# Patient Record
Sex: Female | Born: 1935
Health system: Southern US, Community
[De-identification: ages and names within clinical notes are randomized; demographics above are authoritative.]

## PROBLEM LIST (undated history)

## (undated) DIAGNOSIS — I10 Essential (primary) hypertension: Secondary | ICD-10-CM

## (undated) DIAGNOSIS — H3530 Unspecified macular degeneration: Secondary | ICD-10-CM

## (undated) DIAGNOSIS — H348192 Central retinal vein occlusion, unspecified eye, stable: Secondary | ICD-10-CM

## (undated) DIAGNOSIS — R251 Tremor, unspecified: Secondary | ICD-10-CM

## (undated) DIAGNOSIS — I2510 Atherosclerotic heart disease of native coronary artery without angina pectoris: Secondary | ICD-10-CM

## (undated) DIAGNOSIS — B029 Zoster without complications: Secondary | ICD-10-CM

## (undated) HISTORY — DX: Central retinal vein occlusion, unspecified eye, stable: H34.8192

## (undated) HISTORY — DX: Unspecified macular degeneration: H35.30

---

## 1999-08-20 DIAGNOSIS — I2510 Atherosclerotic heart disease of native coronary artery without angina pectoris: Secondary | ICD-10-CM

## 1999-08-20 HISTORY — DX: Atherosclerotic heart disease of native coronary artery without angina pectoris: I25.10

## 2006-07-16 ENCOUNTER — Ambulatory Visit: Payer: Self-pay | Admitting: Internal Medicine

## 2007-01-22 ENCOUNTER — Ambulatory Visit: Payer: Self-pay | Admitting: Internal Medicine

## 2007-06-11 ENCOUNTER — Other Ambulatory Visit: Payer: Self-pay

## 2007-06-11 ENCOUNTER — Emergency Department: Payer: Self-pay | Admitting: Internal Medicine

## 2008-12-16 ENCOUNTER — Ambulatory Visit: Payer: Self-pay | Admitting: Family Medicine

## 2008-12-16 ENCOUNTER — Encounter: Payer: Self-pay | Admitting: Sports Medicine

## 2008-12-16 DIAGNOSIS — G252 Other specified forms of tremor: Secondary | ICD-10-CM | POA: Insufficient documentation

## 2008-12-16 DIAGNOSIS — E785 Hyperlipidemia, unspecified: Secondary | ICD-10-CM | POA: Insufficient documentation

## 2008-12-16 DIAGNOSIS — I2510 Atherosclerotic heart disease of native coronary artery without angina pectoris: Secondary | ICD-10-CM | POA: Insufficient documentation

## 2008-12-16 DIAGNOSIS — G250 Essential tremor: Secondary | ICD-10-CM

## 2008-12-16 DIAGNOSIS — I10 Essential (primary) hypertension: Secondary | ICD-10-CM | POA: Insufficient documentation

## 2008-12-16 LAB — CONVERTED CEMR LAB
ALT: 28 U/L (ref 0–35)
AST: 33 U/L (ref 0–37)
Albumin: 4 g/dL (ref 3.5–5.2)
Alkaline Phosphatase: 170 units/L — ABNORMAL HIGH (ref 39–117)
BUN: 19 mg/dL (ref 6–23)
CO2: 23 meq/L (ref 19–32)
Calcium: 9 mg/dL (ref 8.4–10.5)
Chloride: 106 meq/L (ref 96–112)
Cholesterol: 139 mg/dL (ref 0–200)
Creatinine, Ser: 0.96 mg/dL (ref 0.40–1.20)
Glucose, Bld: 106 mg/dL — ABNORMAL HIGH (ref 70–99)
HCT: 35.8 % — ABNORMAL LOW (ref 36.0–46.0)
HDL: 40 mg/dL (ref >39)
Hemoglobin: 11.8 g/dL — ABNORMAL LOW (ref 12.0–15.0)
LDL Cholesterol: 67 mg/dL (ref 0–99)
MCHC: 33 g/dL (ref 30.0–36.0)
MCV: 93.5 fL (ref 78.0–100.0)
Platelets: 213 K/uL (ref 150–400)
Potassium: 4.1 meq/L (ref 3.5–5.3)
RBC: 3.83 M/uL — ABNORMAL LOW (ref 3.87–5.11)
RDW: 13.8 % (ref 11.5–15.5)
Sodium: 141 meq/L (ref 135–145)
TSH: 1.577 u[IU]/mL (ref 0.350–4.500)
Total Bilirubin: 0.2 mg/dL — ABNORMAL LOW (ref 0.3–1.2)
Total CHOL/HDL Ratio: 3.5
Total Protein: 7.1 g/dL (ref 6.0–8.3)
Triglycerides: 162 mg/dL — ABNORMAL HIGH (ref <150)
VLDL: 32 mg/dL (ref 0–40)
WBC: 7.5 10*3/uL (ref 4.0–10.5)

## 2008-12-19 ENCOUNTER — Encounter: Payer: Self-pay | Admitting: Sports Medicine

## 2008-12-19 ENCOUNTER — Encounter: Admission: RE | Admit: 2008-12-19 | Discharge: 2008-12-19 | Payer: Self-pay | Admitting: Sports Medicine

## 2009-01-05 ENCOUNTER — Ambulatory Visit: Payer: Self-pay | Admitting: Family Medicine

## 2009-01-05 DIAGNOSIS — M810 Age-related osteoporosis without current pathological fracture: Secondary | ICD-10-CM | POA: Insufficient documentation

## 2009-01-05 DIAGNOSIS — R627 Adult failure to thrive: Secondary | ICD-10-CM | POA: Insufficient documentation

## 2009-01-12 ENCOUNTER — Ambulatory Visit: Payer: Self-pay | Admitting: Family Medicine

## 2009-02-17 ENCOUNTER — Ambulatory Visit: Payer: Self-pay | Admitting: Family Medicine

## 2009-04-20 ENCOUNTER — Ambulatory Visit: Payer: Self-pay | Admitting: Family Medicine

## 2009-04-27 ENCOUNTER — Encounter: Payer: Self-pay | Admitting: Sports Medicine

## 2009-05-02 ENCOUNTER — Ambulatory Visit: Payer: Self-pay | Admitting: Family Medicine

## 2009-05-23 ENCOUNTER — Encounter: Payer: Self-pay | Admitting: Sports Medicine

## 2009-05-24 ENCOUNTER — Ambulatory Visit: Payer: Self-pay | Admitting: Family Medicine

## 2009-06-14 ENCOUNTER — Ambulatory Visit: Payer: Self-pay | Admitting: Family Medicine

## 2009-08-21 ENCOUNTER — Encounter (INDEPENDENT_AMBULATORY_CARE_PROVIDER_SITE_OTHER): Payer: Self-pay | Admitting: *Deleted

## 2009-08-21 DIAGNOSIS — F17200 Nicotine dependence, unspecified, uncomplicated: Secondary | ICD-10-CM

## 2009-09-01 ENCOUNTER — Telehealth: Payer: Self-pay | Admitting: Sports Medicine

## 2009-09-05 ENCOUNTER — Ambulatory Visit: Payer: Self-pay | Admitting: Family Medicine

## 2009-12-05 ENCOUNTER — Encounter: Payer: Self-pay | Admitting: Family Medicine

## 2009-12-05 ENCOUNTER — Ambulatory Visit: Payer: Self-pay | Admitting: Family Medicine

## 2009-12-05 LAB — CONVERTED CEMR LAB
BUN: 18 mg/dL (ref 6–23)
Chloride: 108 meq/L (ref 96–112)
Potassium: 3.9 meq/L (ref 3.5–5.3)

## 2009-12-20 ENCOUNTER — Ambulatory Visit: Payer: Self-pay | Admitting: Family Medicine

## 2009-12-20 ENCOUNTER — Encounter: Payer: Self-pay | Admitting: Sports Medicine

## 2009-12-20 DIAGNOSIS — B351 Tinea unguium: Secondary | ICD-10-CM

## 2009-12-20 DIAGNOSIS — J309 Allergic rhinitis, unspecified: Secondary | ICD-10-CM | POA: Insufficient documentation

## 2009-12-20 LAB — CONVERTED CEMR LAB
ALT: 28 units/L (ref 0–35)
AST: 35 units/L (ref 0–37)
Albumin: 3.9 g/dL (ref 3.5–5.2)
Alkaline Phosphatase: 197 U/L — ABNORMAL HIGH (ref 39–117)
Bilirubin, Direct: 0.1 mg/dL (ref 0.0–0.3)
Indirect Bilirubin: 0.2 mg/dL (ref 0.0–0.9)
Total Bilirubin: 0.3 mg/dL (ref 0.3–1.2)
Total Protein: 7.3 g/dL (ref 6.0–8.3)

## 2009-12-21 ENCOUNTER — Telehealth: Payer: Self-pay | Admitting: Sports Medicine

## 2009-12-22 ENCOUNTER — Ambulatory Visit: Payer: Self-pay | Admitting: Family Medicine

## 2009-12-25 ENCOUNTER — Ambulatory Visit: Payer: Self-pay | Admitting: Family Medicine

## 2010-02-01 ENCOUNTER — Telehealth: Payer: Self-pay | Admitting: Sports Medicine

## 2010-02-12 ENCOUNTER — Ambulatory Visit: Payer: Self-pay | Admitting: Family Medicine

## 2010-02-12 ENCOUNTER — Encounter: Payer: Self-pay | Admitting: Family Medicine

## 2010-02-12 LAB — CONVERTED CEMR LAB
MCHC: 31.1 g/dL (ref 30.0–36.0)
MCV: 94.7 fL (ref 78.0–100.0)
Platelets: 336 10*3/uL (ref 150–400)
TSH: 0.962 microintl units/mL (ref 0.350–4.500)
WBC: 11.1 10*3/uL — ABNORMAL HIGH (ref 4.0–10.5)

## 2010-02-14 ENCOUNTER — Telehealth: Payer: Self-pay | Admitting: Family Medicine

## 2010-02-15 ENCOUNTER — Ambulatory Visit: Payer: Self-pay | Admitting: Family Medicine

## 2010-03-09 ENCOUNTER — Ambulatory Visit: Payer: Self-pay | Admitting: Family Medicine

## 2010-03-20 ENCOUNTER — Ambulatory Visit: Payer: Self-pay | Admitting: Family Medicine

## 2010-04-27 ENCOUNTER — Encounter: Payer: Self-pay | Admitting: Sports Medicine

## 2010-04-27 ENCOUNTER — Encounter: Payer: Self-pay | Admitting: *Deleted

## 2010-06-05 ENCOUNTER — Ambulatory Visit: Payer: Self-pay | Admitting: Family Medicine

## 2010-06-11 ENCOUNTER — Encounter: Payer: Self-pay | Admitting: Sports Medicine

## 2010-06-11 ENCOUNTER — Ambulatory Visit: Payer: Self-pay | Admitting: Family Medicine

## 2010-06-11 LAB — CONVERTED CEMR LAB: Direct LDL: 73 mg/dL

## 2010-06-12 ENCOUNTER — Encounter: Payer: Self-pay | Admitting: Sports Medicine

## 2010-06-25 ENCOUNTER — Encounter: Payer: Self-pay | Admitting: Sports Medicine

## 2010-09-10 ENCOUNTER — Ambulatory Visit: Admission: RE | Admit: 2010-09-10 | Discharge: 2010-09-10 | Payer: Self-pay | Source: Home / Self Care

## 2010-09-18 NOTE — Assessment & Plan Note (Signed)
Summary: BONIVA INJ/KH   Vital Signs:  Patient profile:   77 year old female Height:      63 inches Weight:      103 pounds BMI:     18.31 Temp:     98.0 degrees F oral Pulse rate:   66 / minute BP sitting:   132 / 74  (right arm) Cuff size:   regular  Vitals Entered By: Tessie Fass CMA (March 09, 2010 10:16 AM) CC: boniva injection   Primary Care Provider:  Rodney Langton, M.D.  CC:  boniva injection.  History of Present Illness: Here for IV Boniva.  Reports weight loss assoicated with loss of taste.  She believes it is due to the oral antifungal prescribed for onycomycosis of right great nail. She has stopped taking it.  She is concerned about her nail not improving.  Discussed that this is a long process.    Primidone prescribed at [facility] for tremor helped, she was not given refils.  She has an apt with Dr. Karie Schwalbe August 2. to discuss these and other issues.  Current Medications (verified): 1)  Aspirin Ec Low Dose 81 Mg Tbec (Aspirin) .... One Tab By Mouth Daily 2)  Toprol Xl 100 Mg Xr24h-Tab (Metoprolol Succinate) .... One Tab By Mouth Daily 3)  Lisinopril 10 Mg Tabs (Lisinopril) .... One Tab By Mouth Daily 4)  Simvastatin 40 Mg Tabs (Simvastatin) .... One Half Tab By Mouth Daily 5)  D 1000 1000 Unit Tabs (Cholecalciferol) .... One Tab By Mouth Daily 6)  Boniva 3 Mg/33ml Kit (Ibandronate Sodium) .... Inj Q 3 Months 7)  Calcium Antacid 500 Mg Chew (Calcium Carbonate Antacid) .... 4 Per Day 8)  Flonase 50 Mcg/act Susp (Fluticasone Propionate) .... One Spray in Each Nostril Two Times A Day Even When Not Having Symptoms. 9)  Primidone 50 Mg Tabs (Primidone) .... One Half Tab By Mouth Qhs 10)  Mirtazapine 7.5 Mg Tabs (Mirtazapine) .Marland Kitchen.. 1 Tab By Mouth Qhs 11)  Terbinafine Hcl 250 Mg Tabs (Terbinafine Hcl) .... Hold  Allergies (verified): No Known Drug Allergies  Review of Systems       The patient complains of anorexia.  The patient denies fever, peripheral edema,  headaches, and abdominal pain.   MS:  denies subtrochanteric pain in legs.  Physical Exam  General:  alert, frail, head bobbint tremor, quiet spoken Extremities:  Left great toe nail, thickened, appears to have normal growth at the bed.   Impression & Recommendations:  Problem # 1:  ONYCHOMYCOSIS, TOENAILS (ICD-110.1)  Hold for now.  Topical care with soaking, filing and using Methol rub daily.  Consider pulse therapy (weekely for 6 months) since it appears it intefers with ability to eat secondary to loss of taste and subsequent weight loss. Her updated medication list for this problem includes:    Terbinafine Hcl 250 Mg Tabs (Terbinafine hcl) ..... Hold  Her updated medication list for this problem includes:    Terbinafine Hcl 250 Mg Tabs (Terbinafine hcl) ..... Hold  Orders: [facility]- Est  Level 4 (99214)  Problem # 2:  TREMOR, ESSENTIAL (ICD-333.1)  refilled primidone, did not give refills will let primary MD and patient decided if she is to continue  Orders: [facility]- Est  Level 4 (16109)  Problem # 3:  OSTEOPOROSIS (ICD-733.00)  IVP Boniva given today, no complications, no complaints of leg pain. Boniva Lot #[id number], exp 9/12 Her updated medication list for this problem includes:    D 1000 1000 Unit Tabs (  Cholecalciferol) ..... One tab by mouth daily    Boniva 3 Mg/59ml Kit (Ibandronate sodium) ..... Inj q 3 months  Orders: Provider Misc Charge- [facility] (Misc)  Her updated medication list for this problem includes:    D 1000 1000 Unit Tabs (Cholecalciferol) ..... One tab by mouth daily    Boniva 3 Mg/67ml Kit (Ibandronate sodium) ..... Inj q 3 months  Complete Medication List: 1)  Aspirin Ec Low Dose 81 Mg Tbec (Aspirin) .... One tab by mouth daily 2)  Toprol Xl 100 Mg Xr24h-tab (Metoprolol succinate) .... One tab by mouth daily 3)  Lisinopril 10 Mg Tabs (Lisinopril) .... One tab by mouth daily 4)  Simvastatin 40 Mg Tabs (Simvastatin) .... One half tab by mouth daily 5)  D  1000 1000 Unit Tabs (Cholecalciferol) .... One tab by mouth daily 6)  Boniva 3 Mg/30ml Kit (Ibandronate sodium) .... Inj q 3 months 7)  Calcium Antacid 500 Mg Chew (Calcium carbonate antacid) .... 4 per day 8)  Flonase 50 Mcg/act Susp (Fluticasone propionate) .... One spray in each nostril two times a day even when not having symptoms. 9)  Primidone 50 Mg Tabs (Primidone) .... One half tab by mouth qhs 10)  Mirtazapine 7.5 Mg Tabs (Mirtazapine) .Marland Kitchen.. 1 tab by mouth qhs 11)  Terbinafine Hcl 250 Mg Tabs (Terbinafine hcl) .... Hold  Patient Instructions: 1)  Recommend topical care of nails with soaking, filing, and methol rub daily.  Discuss with Dr. Karie Schwalbe change to pulse therapy. 2)  COntinue primidone and pay attention to if it help treamor and if she wants to continue using this. 3)  Apt with primary MD in 2 weeks Prescriptions: PRIMIDONE 50 MG TABS (PRIMIDONE) One half tab by mouth qHS  #30 x 0   Entered and Authorized by:   Luretha Murphy NP   Signed by:   Luretha Murphy NP on 03/09/2010   Method used:   Printed then faxed to ...       [facility] [address] [phone]* (retail)       [address] [place], [place]  [address]       Ph: [phone]       Fax: [phone]   RxID:   [id number]

## 2010-09-18 NOTE — Progress Notes (Signed)
Summary: refill  Phone Note Refill Request Call back at Home Phone [phone] Message from:  Patient  Refills Requested: Medication #1:  PROPRANOLOL HCL 40 MG TABS 2 tabs by mouth BID [facility] [place]  Initial call taken by: De Nurse,  September 01, 2009 2:29 PM  Follow-up for Phone Call        to PCP Follow-up by: Gladstone Pih,  September 01, 2009 3:15 PM    New/Updated Medications: PROPRANOLOL HCL 80 MG TABS (PROPRANOLOL HCL) One tab by mouth BID Prescriptions: PROPRANOLOL HCL 80 MG TABS (PROPRANOLOL HCL) One tab by mouth BID  #180 x 11   Entered and Authorized by:   Rodney Langton MD   Signed by:   Rodney Langton MD on 09/02/2009   Method used:   Electronically to        [facility] [phone]* (retail)       [address] [place], [place]  [address]       Ph: [phone]       Fax: [phone]   RxID:   [id number]

## 2010-09-18 NOTE — Progress Notes (Signed)
Summary: Results  Phone Note Outgoing Call Call back at Home Phone [phone]   Call placed by: Romero Belling MD,  February 14, 2010 11:44 AM Call placed to: Patient Summary of Call: No answer.  Left message:  results back (did not give details of what).  Advised that nothing requiring action now but should be discussed in appointment which is scheduled in geri clinic tomorrow morning.  Did not give any details of labs or what results I had reviewed.

## 2010-09-18 NOTE — Consult Note (Signed)
 Summary: [facility]  [facility]   Imported By: Madelin Daring 05/04/2009 15:51:27  _____________________________________________________________________  External Attachment:    Type:   Image     Comment:   External Document

## 2010-09-18 NOTE — Assessment & Plan Note (Signed)
Summary: f/u,df   Vital Signs:  Patient profile:   77 year old female Weight:      101.4 pounds Temp:     97.9 degrees F oral Pulse rate:   71 / minute Pulse rhythm:   regular BP sitting:   142 / 88  (left arm) Cuff size:   regular  Vitals Entered By: Loralee Pacas CMA (June 11, 2010 1:51 PM) CC: follow-up visit   Primary Care Xitlalli Newhard:  Rodney Langton, M.D.  CC:  follow-up visit.  History of Present Illness: 77 yo female here for fu of chronic issues.  HTN:  BP ok, no problems with meds.  ECHO performed by cards and normal.   Cholesterol:  Controlled at last check > 1 yr ago, due for recheck.  FTT:  Unable to tolerate higher doses of her remeron, amenable to increase again.  Feels well.  No weakness.  Weight stable since last visit.  Tremor:  Essential.  Better with primidone.  Some neck pain/stiffness.  No radicular symptoms.  Habits & Providers  Alcohol-Tobacco-Diet     Alcohol drinks/day: <1     Alcohol type: wine     Tobacco Status: current     Tobacco Counseling: to quit use of tobacco products     Cigarette Packs/Day: 0.5     Year Started: 1950  Current Medications (verified): 1)  Aspirin Ec Low Dose 81 Mg Tbec (Aspirin) .... One Tab By Mouth Daily 2)  Toprol Xl 100 Mg Xr24h-Tab (Metoprolol Succinate) .... One Tab By Mouth Daily 3)  Lisinopril 10 Mg Tabs (Lisinopril) .... One Tab By Mouth Daily 4)  Simvastatin 40 Mg Tabs (Simvastatin) .... One Half Tab By Mouth Daily 5)  D 1000 1000 Unit Tabs (Cholecalciferol) .... One Tab By Mouth Daily 6)  Boniva 3 Mg/7ml Kit (Ibandronate Sodium) .... Inj Q 3 Months 7)  Calcium Antacid 500 Mg Chew (Calcium Carbonate Antacid) .... 4 Per Day 8)  Flonase 50 Mcg/act Susp (Fluticasone Propionate) .... One Spray in Each Nostril Two Times A Day Even When Not Having Symptoms. 9)  Primidone 50 Mg Tabs (Primidone) .... One Half Tab By Mouth Qhs 10)  Mirtazapine 7.5 Mg Tabs (Mirtazapine) .... One Half  Tab By Mouth Qhs 11)   Grifulvin V 500 Mg Tabs (Griseofulvin Microsize) .... 2 Tabs By Mouth Daily X 6 Months.  Allergies (verified): No Known Drug Allergies  Past History:  Past Surgical History: Bilateral Tubal Ligation CAD s/p stenting in 2001 by Dr. Ria Bush at [facility] Cholecystectomy 2D ECHO normal 05/17/10  Review of Systems       See HPI  Physical Exam  General:  Well-developed,well-nourished,in no acute distress; alert,appropriate and cooperative throughout examination Lungs:  Normal respiratory effort, chest expands symmetrically. Lungs are clear to auscultation, no crackles or wheezes. Heart:  Normal rate and regular rhythm. S1 and S2 normal without gallop, murmur, click, rub or other extra sounds. Abdomen:  Bowel sounds positive,abdomen soft and non-tender without masses, organomegaly or hernias noted. Extremities:  No clubbing, cyanosis, edema, or deformity noted.    Impression & Recommendations:  Problem # 1:  FAILURE TO THRIVE, ADULT (ICD-783.7) Assessment Unchanged Pt to increase to full tab mirtazepine.  This is still only 7.5 mg and best wt gain noted at 15mg  dose. At some point can try to increase to full dose however subjectively she feels well and we would only be treating a number. Had been using nutritional shakes from [facility] but hasn't been going lately because  she has a tough time getting to [facility].  Problem # 2:  ONYCHOMYCOSIS, TOENAILS (ICD-110.1) Assessment: Unchanged Still has medication left.   RTC when finished course of treatment.  Her updated medication list for this problem includes:    Grifulvin V 500 Mg Tabs (Griseofulvin microsize) .Marland Kitchen... 2 tabs by mouth daily x 6 months.  Problem # 3:  TREMOR, ESSENTIAL (ICD-333.1) Assessment: Unchanged Cont primidone. Rehab exercises given for neck pain.  Orders: [facility]- Est  Level 4 (16109)  Problem # 4:  HYPERLIPIDEMIA (ICD-272.4) Assessment: Unchanged Checking dLDL.  Her updated medication  list for this problem includes:    Simvastatin 40 Mg Tabs (Simvastatin) ..... One half tab by mouth daily  Orders: Direct LDL-[facility] ([address]454-09811) [facility]- Est  Level 4 (91478)  Problem # 5:  HYPERTENSION, ESSENTIAL (ICD-401.9) Assessment: Unchanged Liberal with her BPs as wouldn't want her to fall and BPs have been controlled in the recent past.  Her updated medication list for this problem includes:    Toprol Xl 100 Mg Xr24h-tab (Metoprolol succinate) ..... One tab by mouth daily    Lisinopril 10 Mg Tabs (Lisinopril) ..... One tab by mouth daily  Complete Medication List: 1)  Aspirin Ec Low Dose 81 Mg Tbec (Aspirin) .... One tab by mouth daily 2)  Toprol Xl 100 Mg Xr24h-tab (Metoprolol succinate) .... One tab by mouth daily 3)  Lisinopril 10 Mg Tabs (Lisinopril) .... One tab by mouth daily 4)  Simvastatin 40 Mg Tabs (Simvastatin) .... One half tab by mouth daily 5)  D 1000 1000 Unit Tabs (Cholecalciferol) .... One tab by mouth daily 6)  Boniva 3 Mg/33ml Kit (Ibandronate sodium) .... Inj q 3 months 7)  Calcium Antacid 500 Mg Chew (Calcium carbonate antacid) .... 4 per day 8)  Flonase 50 Mcg/act Susp (Fluticasone propionate) .... One spray in each nostril two times a day even when not having symptoms. 9)  Primidone 50 Mg Tabs (Primidone) .... One half tab by mouth qhs 10)  Mirtazapine 7.5 Mg Tabs (Mirtazapine) .... One half  tab by mouth qhs 11)  Grifulvin V 500 Mg Tabs (Griseofulvin microsize) .... 2 tabs by mouth daily x 6 months.  Patient Instructions: 1)  Great to see you today! 2)  Checking cholesterol today, will forward to your cardiologist. 3)  Do the neck exercises. 4)  Come back in November for your flu shot. 5)  -Dr. Karie Schwalbe.   Orders Added: 1)  Direct LDL-[facility] [[id number]] 2)  [facility]- Est  Level 4 [57846]

## 2010-09-18 NOTE — Miscellaneous (Signed)
Summary: npi FOR CARDS  Clinical Lists Changes gave NPI for Dr. Ann Maki at [facility] (cards) for her appt there today.Golden Circle RN  April 27, 2010 11:36 AM

## 2010-09-18 NOTE — Assessment & Plan Note (Signed)
 Summary: f/up,tcb   Vital Signs:  Patient profile:   77 year old female Weight:      101 pounds Temp:     97.5 degrees F oral Pulse rate:   75 / minute BP sitting:   164 / 89  (left arm)  Vitals Entered By: Letitia Reusing (Jan 05, 2009 2:54 PM)  CC: f/u Is Patient Diabetic? No   CC:  f/u.  History of Present Illness: 8 WF with HTN, HLD, essential tremor, CAD presents for follow up of medical problems.  Osteoporosis:  Meets criteria per recent DEXA.  Will start treatment.  Pt has tried fosamax before and was unable to tolerate it 2/2 perceived fatigue and so she refuses to take it.  Recommended q40month Boniva  and she was amenable to trying this.  HLD:  Taking 1/2 tab of Zocor  40 daily.  Lipids WNL except triglycerides.  CAD:  No further episodes of CP.  HTN:  Taking BP meds as prescribed.  No HA, blurry vision, CP, SOB.  Essential Tremor:  Pt feels that low dose propranolol helped significantly.  Would like to increase dose.  Still causing some neck pain.  Orthostatic Dizziness:  States that compression stockings are to expensive.  Would like to try support hose.  Still symptomatic when arising after sitting for a long period of time.  Weight loss/FTT:  Has been trying to gain weight but unable.  Just does not have the appetite.  Would like to try something to stimulate appetite.  Has lost 20-30 pounds over the past several years.  Habits & Providers     Tobacco Status: current     Cigarette Packs/Day: 0.5  Past History:  Past Medical History:    HTN    HLD    CAD s/p stent in 2001    MI in 2001    Orthostatic Hypotension    Hx Urinary retention in 1993    Hx Pancreatitis in 1994 (12/16/2008)  Past Surgical History:    Bilateral Tubal Ligation    CAD s/p stenting in 2001 by Dr. Tawana at [facility]    Cholecystectomy (12/16/2008)  Family History:    Heart disease in both maternal and paternal sides.    Mother had a stroke.    Hx Bone cancer  in family (12/16/2008)  Social History:    Lives at home alone, daughter Dorthea Dene Boers very helpful    Does all ADLs and IADLs independently.    Likes to go bowling and play computer games.    Completed High School    Drives a car (12/16/2008)  Social History:    Packs/Day:  0.5  Review of Systems       See HPI  Physical Exam  General:  Well-developed,well-nourished,in no acute distress; alert,appropriate and cooperative throughout examination Lungs:  Normal respiratory effort, chest expands symmetrically. Lungs are clear to auscultation, no crackles or wheezes. Heart:  Normal rate and regular rhythm. S1 and S2 normal without gallop, murmur, click, rub or other extra sounds. Extremities:  No clubbing, cyanosis, edema, or deformity noted with normal full range of motion of all joints.     Impression & Recommendations:  Problem # 1:  OSTEOPOROSIS (ICD-733.00) Assessment New Will refer to Geriatrics for Boniva  injections.    Her updated medication list for this problem includes:    D 1000 1000 Unit Tabs (Cholecalciferol ) ..... One tab by mouth daily    Boniva  3 Mg/67ml Kit (Ibandronate  sodium) ..... Inj q 3 months  Orders: [facility]- Est  Level 4 (99214)  Problem # 2:  TREMOR, ESSENTIAL (ICD-333.1) Assessment: Improved Increased propranolol.  Pt feels that this helps her tremor.    Orders: [facility]- Est  Level 4 (00785)  Problem # 3:  ORTHOSTATIC DIZZINESS (ICD-780.4) Assessment: Unchanged Still symptomatic and still hasn't gotten the compression hose because it is too expensive.  She agrees to try support hose.  If still symptomatic can consider liberalizing salt in diet however will need to keep her blood pressure in mind as well.  Orders: [facility]- Est  Level 4 (00785)  Problem # 4:  HYPERTENSION, ESSENTIAL (ICD-401.9) Assessment: Improved BP increased, with age and CAD, stroke and ACS are risks, BP elevated at this visit so will increase toprol  XL.  There has been no  interaction thusfar with propranolol and her pulse seems to tolerate well.  Still will titrate up meds slowly.  Her updated medication list for this problem includes:    Toprol  Xl 100 Mg Xr24h-tab (Metoprolol  succinate)    Lisinopril  10 Mg Tabs (Lisinopril ) ..... One tab by mouth daily  Orders: [facility]- Est  Level 4 (00785)  Problem # 5:  FAILURE TO THRIVE, ADULT (ICD-783.7) Very common in the geriatric population.  Will try Mirtazepine to stimulate appetite and track weights at each visit.    Complete Medication List: 1)  Aspirin  Ec Low Dose 81 Mg Tbec (Aspirin ) .... One tab by mouth daily 2)  Toprol  Xl 100 Mg Xr24h-tab (Metoprolol  succinate) 3)  Lisinopril  10 Mg Tabs (Lisinopril ) .... One tab by mouth daily 4)  Simvastatin  40 Mg Tabs (Simvastatin ) .... One half tab by mouth daily 5)  Propranolol Hcl 40 Mg Tabs (Propranolol hcl) .... 2 tabs by mouth bid 6)  Mirtazapine  15 Mg Tbdp (Mirtazapine ) .... One tab by mouth qhs 7)  Cyclobenzaprine Hcl 5 Mg Tabs (Cyclobenzaprine hcl) .... One tab by mouth qhs 8)  D 1000 1000 Unit Tabs (Cholecalciferol ) .... One tab by mouth daily 9)  Boniva  3 Mg/36ml Kit (Ibandronate  sodium) .... Inj q 3 months  Patient Instructions: 1)  Great to see you today, lots of changes. 2)  Take 2 of your propranolol two times a day.  This will help with the tremor. 3)  Take the cyclobenzaprine before bedtime, this can help with your neck pain. 4)  Increase your Toprol  XL to 100mg  daily to help your blood pressure. 5)  Mirtazapine  will help with your appetite. 6)  Make an appointment with the geriatric clinic to see Ms Gerlene our nurse practitioner.  I would like her to start giving your Boniva  injections every 3 months.  You should also take a vitamin D  supplement. 7)  Don't forget the support hose. 8)  Come back to see me in one month to see how things are going. Prescriptions: TOPROL  XL 100 MG XR24H-TAB (METOPROLOL  SUCCINATE)   #90 x 3   Entered and Authorized by:    Debby Petties MD   Signed by:   Debby Petties MD on 01/05/2009   Method used:   Print then Give to Patient   RxID:   [id number] D 1000 1000 UNIT TABS (CHOLECALCIFEROL ) One tab by mouth daily  #90 x 3   Entered and Authorized by:   Debby Petties MD   Signed by:   Debby Petties MD on 01/05/2009   Method used:   Print then Give to Patient   RxID:   [id number] CYCLOBENZAPRINE HCL 5 MG TABS (CYCLOBENZAPRINE HCL) One tab by mouth qHS  #  90 x 3   Entered and Authorized by:   Debby Petties MD   Signed by:   Debby Petties MD on 01/05/2009   Method used:   Print then Give to Patient   RxID:   [id number] MIRTAZAPINE  15 MG TBDP (MIRTAZAPINE ) One tab by mouth qHS  #90 x 3   Entered and Authorized by:   Debby Petties MD   Signed by:   Debby Petties MD on 01/05/2009   Method used:   Print then Give to Patient   RxID:   [id number]

## 2010-09-18 NOTE — Miscellaneous (Signed)
 Summary: rib injury  Clinical Lists Changes spoke with dtr. pt & family heard loud pop Saturday when getting off bleachers at football game. pt denied pain. afterwards had rib pain & area is swollen. declined ED or visit here today. states she will be fine until tomorrow. appt made with pcp at 11am. told dtr that if pt starts coughing up blood, pain increases or difficulty breathing, call 911 & go to ED .she verbalized understanding...SABRASABRAGinnie Mau RN  May 23, 2009 3:04 PM

## 2010-09-18 NOTE — Assessment & Plan Note (Signed)
Summary: F/U PER DR LINTHAVONG/[facility]   Vital Signs:  Patient profile:   77 year old female Weight:      111 pounds Temp:     98.1 degrees F oral Pulse rate:   69 / minute Pulse rhythm:   regular BP sitting:   148 / 79  (left arm)  Vitals Entered By: Loralee Pacas CMA (Dec 25, 2009 1:30 PM)  Primary Care Provider:  Rodney Langton, M.D.   History of Present Illness: 81F here for fu of essential tremor and onychomycosis.  Essential tremor:  Had tried primidone 50 qHS, had excessive drowsiness and stopped.  Tremor was GONE after taking the medication.  Amenable to trying one half tab.  Adverse Drug reaction:  See #1.  Drowsiness and feeling "not self."  Onychomycosis:  Almost done with doxy, taking terbinafine, LFTs nl, toe much better.  Current Medications (verified): 1)  Aspirin Ec Low Dose 81 Mg Tbec (Aspirin) .... One Tab By Mouth Daily 2)  Toprol Xl 100 Mg Xr24h-Tab (Metoprolol Succinate) .... One Tab By Mouth Daily 3)  Lisinopril 10 Mg Tabs (Lisinopril) .... One Tab By Mouth Daily 4)  Simvastatin 40 Mg Tabs (Simvastatin) .... One Half Tab By Mouth Daily 5)  Mirtazapine 15 Mg Tbdp (Mirtazapine) .... One Tab By Mouth Qhs 6)  Cyclobenzaprine Hcl 5 Mg Tabs (Cyclobenzaprine Hcl) .... One Tab By Mouth Qhs 7)  D 1000 1000 Unit Tabs (Cholecalciferol) .... One Tab By Mouth Daily 8)  Boniva 3 Mg/23ml Kit (Ibandronate Sodium) .... Inj Q 3 Months 9)  Calcium Antacid 500 Mg Chew (Calcium Carbonate Antacid) .... 4 Per Day 10)  Doxycycline Hyclate 100 Mg Tabs (Doxycycline Hyclate) .... One Tab By Mouth Two Times A Day X 7 Days 11)  Terbinafine Hcl 250 Mg Tabs (Terbinafine Hcl) .... One Tab By Mouth Daily X 12 Weeks 12)  Flonase 50 Mcg/act Susp (Fluticasone Propionate) .... One Spray in Each Nostril Two Times A Day Even When Not Having Symptoms. 13)  Primidone 50 Mg Tabs (Primidone) .... One Half Tab By Mouth Qhs  Allergies (verified): No Known Drug Allergies  Past History:  Past  Medical History: Last updated: 12/20/2009 HTN HLD CAD s/p stent in 2001 MI in 2001 Orthostatic Hypotension Hx Urinary retention in 1993 Hx Pancreatitis in 1994 Osteoporosis Onychomycosis in toenails. Essential Tremor  Review of Systems       See HPI  Physical Exam  General:  Well-developed,well-nourished,in no acute distress; alert,appropriate and cooperative throughout examination Lungs:  Normal respiratory effort, chest expands symmetrically. Lungs are clear to auscultation, no crackles or wheezes. Heart:  Normal rate and regular rhythm. S1 and S2 normal without gallop, murmur, click, rub or other extra sounds. Msk:  Toe mildly erythematous, no induration, no purulence, better than previous visit. Neurologic:  No cranial nerve deficits noted. Station and gait are normal. Plantar reflexes are down-going bilaterally. DTRs are symmetrical throughout. Sensory, motor and coordinative functions appear intact.  3hz  tremor in hands and neck.   Impression & Recommendations:  Problem # 1:  ADVERSE DRUG REACTION (ICD-995.20) Assessment Improved Resolved, will try primidone at lower dose.  Orders: [facility]- Est  Level 4 (62694)  Problem # 2:  ONYCHOMYCOSIS, TOENAILS (ICD-110.1) Assessment: Unchanged Cont terbinafine.  Her updated medication list for this problem includes:    Terbinafine Hcl 250 Mg Tabs (Terbinafine hcl) ..... One tab by mouth daily x 12 weeks  Orders: [facility]- Est  Level 4 (85462)  Problem # 3:  TREMOR, ESSENTIAL (ICD-333.1)  Assessment: Unchanged See #1, resolved with primidone but had ADR, will cont primidone at one half the dose.  Will call me in 4-5 days to let me know how treatment is going.  Orders: [facility]- Est  Level 4 (45409)  Complete Medication List: 1)  Aspirin Ec Low Dose 81 Mg Tbec (Aspirin) .... One tab by mouth daily 2)  Toprol Xl 100 Mg Xr24h-tab (Metoprolol succinate) .... One tab by mouth daily 3)  Lisinopril 10 Mg Tabs (Lisinopril) .... One tab by  mouth daily 4)  Simvastatin 40 Mg Tabs (Simvastatin) .... One half tab by mouth daily 5)  Mirtazapine 15 Mg Tbdp (Mirtazapine) .... One tab by mouth qhs 6)  Cyclobenzaprine Hcl 5 Mg Tabs (Cyclobenzaprine hcl) .... One tab by mouth qhs 7)  D 1000 1000 Unit Tabs (Cholecalciferol) .... One tab by mouth daily 8)  Boniva 3 Mg/78ml Kit (Ibandronate sodium) .... Inj q 3 months 9)  Calcium Antacid 500 Mg Chew (Calcium carbonate antacid) .... 4 per day 10)  Doxycycline Hyclate 100 Mg Tabs (Doxycycline hyclate) .... One tab by mouth two times a day x 7 days 11)  Terbinafine Hcl 250 Mg Tabs (Terbinafine hcl) .... One tab by mouth daily x 12 weeks 12)  Flonase 50 Mcg/act Susp (Fluticasone propionate) .... One spray in each nostril two times a day even when not having symptoms. 13)  Primidone 50 Mg Tabs (Primidone) .... One half tab by mouth qhs  Patient Instructions: 1)  Restart primidone at one half tab at bedtime. 2)  Refileld toprol. 3)  Come see me after 12 weeks to see how your toenail is doing. 4)  -Dr. Karie Schwalbe. Prescriptions: TOPROL XL 100 MG XR24H-TAB (METOPROLOL SUCCINATE) One tab by mouth daily  #90 x 3   Entered and Authorized by:   Rodney Langton MD   Signed by:   Rodney Langton MD on 12/25/2009   Method used:   Electronically to        [facility] [phone]* (retail)       [address] [place], [place]  [address]       Ph: [phone]       Fax: [phone]   RxID:   [id number]

## 2010-09-18 NOTE — Progress Notes (Signed)
Summary: triage  Phone Note Call from Patient Call back at Home Phone [phone]   Caller: Patient Summary of Call: needs to talk to nurse about how she has been feeling Initial call taken by: De Nurse,  February 01, 2010 11:20 AM  Follow-up for Phone Call        states she has been feeling tired,weak & worn out for past 2 wks. just wants to sleep. food does not taste good. wants to know what is going on. she "finished" mirtazapine & flexeril last week. told her she had refills. states she did feel ok when she was taking them. wants to know if md wants her to get the refill & keep taking or come see him. told her I will call hr back when md responds Follow-up by: Golden Circle RN,  February 01, 2010 11:28 AM  Additional Follow-up for Phone Call Additional follow up Details #1::        I would rather her continue with the mirtazepine, stop the flexeril.  Call if she needs any further refills on mirtezepine and let me know if her appetite returns.  If she doesn't feel better after 2 weeks on mirtazepine, should come in for possible dose adjustment. Additional Follow-up by: Rodney Langton MD,  February 01, 2010 11:42 AM    Additional Follow-up for Phone Call Additional follow up Details #2::    she agreed with plan. told her if she did not feel better before the 2wks are up, call & we will get her in to be seen. she agreed Follow-up by: Golden Circle RN,  February 01, 2010 11:57 AM

## 2010-09-18 NOTE — Assessment & Plan Note (Signed)
Summary: toe fungus/eo   Vital Signs:  Patient profile:   77 year old female Weight:      103.9 pounds Temp:     98.5 degrees F oral Pulse rate:   66 / minute Pulse rhythm:   regular BP sitting:   128 / 68  (right arm) Cuff size:   regular  Vitals Entered By: Loralee Pacas CMA (March 20, 2010 11:13 AM)  Primary Care Provider:  Rodney Langton, M.D.   History of Present Illness: Pt here with concerns re: fungus medicine.  Was taking mirtazepine for FTT, improved appetite significantly.  Terbinafine rx'ed however this made food taste bad to her.  She went off mirtazepine briefly and is off terbinafine.  She is now back to mirtazepine and supplementing with Ensure but still off terbinafine.  Pt feels her nails look better.  Tremor:  Slightly better with primidone, 1/2 tab.  Current Medications (verified): 1)  Aspirin Ec Low Dose 81 Mg Tbec (Aspirin) .... One Tab By Mouth Daily 2)  Toprol Xl 100 Mg Xr24h-Tab (Metoprolol Succinate) .... One Tab By Mouth Daily 3)  Lisinopril 10 Mg Tabs (Lisinopril) .... One Tab By Mouth Daily 4)  Simvastatin 40 Mg Tabs (Simvastatin) .... One Half Tab By Mouth Daily 5)  D 1000 1000 Unit Tabs (Cholecalciferol) .... One Tab By Mouth Daily 6)  Boniva 3 Mg/69ml Kit (Ibandronate Sodium) .... Inj Q 3 Months 7)  Calcium Antacid 500 Mg Chew (Calcium Carbonate Antacid) .... 4 Per Day 8)  Flonase 50 Mcg/act Susp (Fluticasone Propionate) .... One Spray in Each Nostril Two Times A Day Even When Not Having Symptoms. 9)  Primidone 50 Mg Tabs (Primidone) .... One Half Tab By Mouth Qhs 10)  Mirtazapine 7.5 Mg Tabs (Mirtazapine) .Marland Kitchen.. 1 Tab By Mouth Qhs 11)  Grifulvin V 500 Mg Tabs (Griseofulvin Microsize) .... 2 Tabs By Mouth Daily X 6 Months.  Allergies (verified): No Known Drug Allergies  Review of Systems       See HPI  Physical Exam  General:  Well-developed,well-nourished,in no acute distress; alert,appropriate and cooperative throughout  examination Skin:  L great toe, nail thickened, yellow, no signs paronyhia/eponychia.  Matrix slightly cleared.   Impression & Recommendations:  Problem # 1:  ONYCHOMYCOSIS, TOENAILS (ICD-110.1) Assessment Improved Changed to griseofulvin 1000 once daily x 6 mos.  Her updated medication list for this problem includes:    Grifulvin V 500 Mg Tabs (Griseofulvin microsize) .Marland Kitchen... 2 tabs by mouth daily x 6 months.  Orders: [facility]- Est  Level 4 (30865)  Problem # 2:  WEIGHT LOSS, RECENT (HQI-696.29) Assessment: Improved Cont mirtazepine and ensure.  RTC 3 mos to reassess wt loss.  Gained 0.9lb since last visit.  Orders: [facility]- Est  Level 4 (99214)  Problem # 3:  TREMOR, ESSENTIAL (ICD-333.1) Assessment: Improved Cont primidone 1/2 tab.  Orders: [facility]- Est  Level 4 (52841)  Complete Medication List: 1)  Aspirin Ec Low Dose 81 Mg Tbec (Aspirin) .... One tab by mouth daily 2)  Toprol Xl 100 Mg Xr24h-tab (Metoprolol succinate) .... One tab by mouth daily 3)  Lisinopril 10 Mg Tabs (Lisinopril) .... One tab by mouth daily 4)  Simvastatin 40 Mg Tabs (Simvastatin) .... One half tab by mouth daily 5)  D 1000 1000 Unit Tabs (Cholecalciferol) .... One tab by mouth daily 6)  Boniva 3 Mg/12ml Kit (Ibandronate sodium) .... Inj q 3 months 7)  Calcium Antacid 500 Mg Chew (Calcium carbonate antacid) .... 4 per day 8)  Flonase 50 Mcg/act Susp (Fluticasone propionate) .... One spray in each nostril two times a day even when not having symptoms. 9)  Primidone 50 Mg Tabs (Primidone) .... One half tab by mouth qhs 10)  Mirtazapine 7.5 Mg Tabs (Mirtazapine) .... One half  tab by mouth qhs 11)  Grifulvin V 500 Mg Tabs (Griseofulvin microsize) .... 2 tabs by mouth daily x 6 months.  Patient Instructions: 1)  Cont mirtazepine. 2)  Start griseofulvin x6 months for toenail fungus. 3)  Cont primidone. 4)  Come back to see me in 3 months. 5)  -Dr. Karie Schwalbe. Prescriptions: GRIFULVIN V 500 MG TABS (GRISEOFULVIN  MICROSIZE) 2 tabs by mouth daily x 6 months.  #180 x 1   Entered and Authorized by:   Rodney Langton MD   Signed by:   Rodney Langton MD on 03/20/2010   Method used:   Electronically to        [facility] [phone]* (retail)       [address] [place], [place]  [address]       Ph: [phone]       Fax: [phone]   RxID:   [id number]

## 2010-09-18 NOTE — Assessment & Plan Note (Signed)
Summary: fx rib?/Craig/Dr. T   Vital Signs:  Patient profile:   77 year old female Weight:      108 pounds Temp:     98 degrees F BP sitting:   166 / 79  Vitals Entered By: Lillia Pauls CMA (May 24, 2009 11:07 AM)  Primary Care Provider:  Rodney Langton, M.D.  CC:  rib pain.  History of Present Illness: Pleasant 77 yo F with osteoporosis, on calcium/vitamin D and bisphosphonate presenting with concern for rib fracture.  1. Rib Pain: patient was watching a game the other night and when it was over, her grandson helped her down from the bleachers. he stood on her left side and held her left hand in his left hand while putting his right hand on her side. when he put pressure on her right side, he felt and heard a "pop." the patient had no pain at that time and did not feel the pop. later, she started to feel pain on the right side where his hand had been, along 12th rib between mid-axillary and anterior-axillary line. she endorses pain with deep inspiration, palpation, and when she lays on that side, the pain is waking her from sleep. she denies chest pain, cough, hemoptysis, dyspnea, bruising.   2. HTN: Rx Toprol, Lisinopril. she has not taken her medications today and is in pain. she denies CP, SOB, N/V/D/C, HA, dizziness, lower extremity edema.  3. OP: Rx: Boniva, Calcium plus vitamin D. Per patient and her daughter, the patient has a history of low impact fractures of the ribs.   Current Medications (verified): 1)  Aspirin Ec Low Dose 81 Mg Tbec (Aspirin) .... One Tab By Mouth Daily 2)  Toprol Xl 100 Mg Xr24h-Tab (Metoprolol Succinate) 3)  Lisinopril 10 Mg Tabs (Lisinopril) .... One Tab By Mouth Daily 4)  Simvastatin 40 Mg Tabs (Simvastatin) .... One Half Tab By Mouth Daily 5)  Propranolol Hcl 40 Mg Tabs (Propranolol Hcl) .... 2 Tabs By Mouth Bid 6)  Mirtazapine 15 Mg Tbdp (Mirtazapine) .... One Tab By Mouth Qhs 7)  Cyclobenzaprine Hcl 5 Mg Tabs (Cyclobenzaprine Hcl) .... One  Tab By Mouth Qhs 8)  D 1000 1000 Unit Tabs (Cholecalciferol) .... One Tab By Mouth Daily 9)  Boniva 3 Mg/78ml Kit (Ibandronate Sodium) .... Inj Q 3 Months 10)  Calcium Antacid 500 Mg Chew (Calcium Carbonate Antacid) .... 4 Per Day  Allergies (verified): No Known Drug Allergies  Past History:  Past medical, surgical, family and social histories (including risk factors) reviewed for relevance to current acute and chronic problems.  Past Medical History: Reviewed history from 02/17/2009 and no changes required. HTN HLD CAD s/p stent in 2001 MI in 2001 Orthostatic Hypotension Hx Urinary retention in 1993 Hx Pancreatitis in 1994 Osteoporosis  Past Surgical History: Reviewed history from 12/16/2008 and no changes required. Bilateral Tubal Ligation CAD s/p stenting in 2001 by Dr. Ria Bush at [facility] Cholecystectomy  Family History: Reviewed history from 12/16/2008 and no changes required. Heart disease in both maternal and paternal sides. Mother had a stroke. Hx Bone cancer in family  Social History: Reviewed history from 12/16/2008 and no changes required. Lives at home alone, daughter Myles Lipps very helpful Does all ADLs and IADLs independently. Likes to go bowling and play computer games. Completed High School Drives a car  Review of Systems  The patient denies fever, weight loss, weight gain, chest pain, syncope, dyspnea on exertion, peripheral edema, prolonged cough, hemoptysis, abdominal pain, and  severe indigestion/heartburn.    Physical Exam  General:  Well-developed, well-nourished, in no acute distress; alert, appropriate and cooperative throughout examination. Vitals reviewed.  Lungs:  Normal respiratory effort, chest expands symmetrically. Lungs are clear to auscultation, no crackles or wheezes. Heart:  Normal rate and regular rhythm. S1 and S2 normal without gallop, murmur, click, rub or other extra sounds. Msk:  ttp along 12th rib  mid-axillary to anterior-axillary line, + edematous tissue, no step-off identified, no obvious deformity, no bruising.  Psych:  Oriented X3, memory intact for recent and remote, and normally interactive.     Impression & Recommendations:  Problem # 1:  FRACTURE, RIB, RIGHT (ICD-807.00) Assessment New  No red flags. No imaging needed at this time. Advised scheduled Tylenol three times a day for pain and provided prescription for rib belt. Instructed patient to wear only as needed as this could cause atelectasis and PNA if worn too often and deep breaths not taken. Red flags given. Follow up in 1-2 weeks with PCP.   Orders: [facility]- Est Level  3 (73220)  Problem # 2:  HYPERTENSION, ESSENTIAL (ICD-401.9) Assessment: Deteriorated  Patient has not taken medications today. Advised her to restart. Her updated medication list for this problem includes:    Toprol Xl 100 Mg Xr24h-tab (Metoprolol succinate)    Lisinopril 10 Mg Tabs (Lisinopril) ..... One tab by mouth daily    Propranolol Hcl 40 Mg Tabs (Propranolol hcl) .Marland Kitchen... 2 tabs by mouth bid  Orders: [facility]- Est Level  3 (25427)  Problem # 3:  OSTEOPOROSIS (ICD-733.00) Assessment: Unchanged  Her updated medication list for this problem includes:    D 1000 1000 Unit Tabs (Cholecalciferol) ..... One tab by mouth daily    Boniva 3 Mg/69ml Kit (Ibandronate sodium) ..... Inj q 3 months  Orders: [facility]- Est Level  3 (06237)  Complete Medication List: 1)  Aspirin Ec Low Dose 81 Mg Tbec (Aspirin) .... One tab by mouth daily 2)  Toprol Xl 100 Mg Xr24h-tab (Metoprolol succinate) 3)  Lisinopril 10 Mg Tabs (Lisinopril) .... One tab by mouth daily 4)  Simvastatin 40 Mg Tabs (Simvastatin) .... One half tab by mouth daily 5)  Propranolol Hcl 40 Mg Tabs (Propranolol hcl) .... 2 tabs by mouth bid 6)  Mirtazapine 15 Mg Tbdp (Mirtazapine) .... One tab by mouth qhs 7)  Cyclobenzaprine Hcl 5 Mg Tabs (Cyclobenzaprine hcl) .... One tab by mouth qhs 8)  D 1000  1000 Unit Tabs (Cholecalciferol) .... One tab by mouth daily 9)  Boniva 3 Mg/74ml Kit (Ibandronate sodium) .... Inj q 3 months 10)  Calcium Antacid 500 Mg Chew (Calcium carbonate antacid) .... 4 per day  Other Orders: Flu Vaccine 56yrs + (62831) Admin 1st Vaccine (51761)  Patient Instructions: 1)  It was nice to meet you today! 2)  It looks like you have another nondisplaced rib fracture, probably with some muscle strain. 3)  Take Tylenol three times a day x 1 week for pain. 4)  I am also prescribing a rib belt to help with pain. 5)  Please call or go to the emergency department if you have chest pain, problems breathing, or if you are coughing blood. 6)  Please follow up with your PCP in 1-2 weeks.   Immunizations Administered:  Influenza Vaccine # 1:    Vaccine Type: Fluvax 3+    Site: left deltoid    Mfr: [employer]    Dose: 0.5 ml    Route: IM    Given by: Shanda Bumps  Fleeger CMA    Exp. Date: 02/15/2010    Lot #: [id number]    VIS given: 03/12/07 version given May 24, 2009.  Flu Vaccine Consent Questions:    Do you have a history of severe allergic reactions to this vaccine? no    Any prior history of allergic reactions to egg and/or gelatin? no    Do you have a sensitivity to the preservative Thimersol? no    Do you have a past history of Guillan-Barre Syndrome? no    Do you currently have an acute febrile illness? no    Have you ever had a severe reaction to latex? no    Vaccine information given and explained to patient? yes    Are you currently pregnant? no   Prevention & Chronic Care Immunizations   Influenza vaccine: Fluvax 3+  (05/24/2009)    Tetanus booster: Not documented    Pneumococcal vaccine: Not documented    H. zoster vaccine: Not documented  Colorectal Screening   Hemoccult: Not documented   Hemoccult due: Not Indicated    Colonoscopy: Not documented   Colonoscopy due: Not Indicated  Other Screening   Pap smear: Not documented   Pap  smear due: Not Indicated    Mammogram: Not documented   Mammogram due: Not Indicated    DXA bone density scan: Not documented   Smoking status: current  (05/02/2009)  Lipids   Total Cholesterol: 139  (12/16/2008)   LDL: 67  (12/16/2008)   LDL Direct: Not documented   HDL: 40  (12/16/2008)   Triglycerides: 162  (12/16/2008)    SGOT (AST): 33  (12/16/2008)   SGPT (ALT): 28  (12/16/2008)   Alkaline phosphatase: 170  (12/16/2008)   Total bilirubin: 0.2  (12/16/2008)    Lipid flowsheet reviewed?: Yes   Progress toward LDL goal: Unchanged  Hypertension   Last Blood Pressure: 166 / 79  (05/24/2009)   Serum creatinine: 0.96  (12/16/2008)   Serum potassium 4.1  (12/16/2008)    Hypertension flowsheet reviewed?: Yes   Progress toward BP goal: Deteriorated  Self-Management Support :   Personal Goals (by the next clinic visit) :      Personal blood pressure goal: 140/90  (05/24/2009)     Personal LDL goal: 130  (05/24/2009)    Patient will work on the following items until the next clinic visit to reach self-care goals:     Medications and monitoring: take my medicines every day, bring all of my medications to every visit  (05/24/2009)     Eating: drink diet soda or water instead of juice or soda, eat more vegetables, use fresh or frozen vegetables, eat foods that are low in salt, eat baked foods instead of fried foods, eat fruit for snacks and desserts, limit or avoid alcohol  (05/24/2009)     Activity: take a 30 minute walk every day  (05/24/2009)    Hypertension self-management support: Written self-care plan  (05/24/2009)   Hypertension self-care plan printed.    Lipid self-management support: Written self-care plan  (05/24/2009)   Lipid self-care plan printed.

## 2010-09-18 NOTE — Assessment & Plan Note (Signed)
Summary: depression,df   Vital Signs:  Patient profile:   77 year old female Height:      63 inches Weight:      104.3 pounds BMI:     18.54 Temp:     98.4 degrees F oral Pulse rate:   74 / minute BP sitting:   142 / 73  (left arm) Cuff size:   regular  Vitals Entered By: Garen Grams LPN (February 12, 2010 10:08 AM) CC: not eating, no energy, ? depression Is Patient Diabetic? No Pain Assessment Patient in pain? yes     Location: back   Primary Care Provider:  Rodney Langton, M.D.  CC:  not eating, no energy, and ? depression.  History of Present Illness: 77 yo female presenting with concerns about her mood  MOOD DISORDER (ICD-296.90):  "I don't feel like I should."  Not eating well, decreased energy, wonders if she has depression.  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  The problem has been going on for approximately 1 year now, but she and her daughter feel like it has gotten worse the past 2-3 weeks.  She says she has been worried about her children a lot the past 2-3 weeks because her son is moving back to the area and her daughter is ill.  She has been seen by her PCP for failure to thrive in the past with question of mood disorder versus dementia.  She tried to take Mirtazapine in the past (15 mg by mouth at bedtime) but stopped because she felt disoriented and somnolent on it.  She likewise felt weak and somnolent when she started Primidone for tremor but feels better now that she cut her dose to 1/4 - 1/2 tablet at bedtime.  She denies suicidal ideation.  She lives alone, but sees her daughter frequently and relies on her daughter and neighbor for rides.  Patient denies falls.   Habits & Providers  Alcohol-Tobacco-Diet     Tobacco Status: current     Tobacco Counseling: to quit use of tobacco products  Allergies: No Known Drug Allergies  Physical Exam  Additional Exam:   VITALS:  Reviewed, normal GEN: Alert & oriented, no acute distress NECK: Midline trachea, no masses/thyromegaly, no cervical lymphadenopathy CARDIO: Regular rate and rhythm, no murmurs/rubs/gallops, 2+ bilateral radial pulses, no carotid bruit RESP: Clear to auscultation, normal work of breathing, no retractions/accessory muscle use  PHQ 9 1 - 3 2 - 2 3 - 3 4 - 3 5 - 3 6 - 3 7 - 3 8 - 3 9 - 0   SUICIDAL?? -- NO TOTAL:  23 / 27 10 - DIFFICULTY = SOMEWHAT    Impression & Recommendations:  Problem # 1:  MOOD DISORDER (ICD-296.90) Assessment Unchanged Sounds like severe loss of function over the past year.  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 No suicidal ideation.  Will check CBC and TSH.  Evaluated the following in clinic today:  - ADLs:  independent  - IADLs:  partially dependent  - MMSE:  30 / 30  MMSE points away from dementia as an underlying component here.  Would consider primary mood disorder.  Mirtazapine was a good first choice of antidepressant as she has had a 7 pound weight loss in the past month.  Given tremor would also consider  Parkinson disease.  Patient has cataract surgery scheduled for tomorrow and will be recovering for a few days from that, so I would be hesitant to start a new medicine at this point, especially since I will be leaving clinic on Thursday and cannot monitor.  Discussed this with patient and daughter and advised that a visit to our geriatric clinic would be a good next step to finish the workup that I started today.  Patient and daughter are in agreement with this plan and will schedule an appointment for next week if possible.  Should follow up with PCP for this issue in the future after geriatric clinic.  Greater than 25 minutes spent with patient, more than 50% in face-to-face counseling. Orders: CBC-[facility] (16109) TSH-[facility] ([id number]) [facility]- Est  Level 4 (91478)  Complete Medication List: 1)  Aspirin Ec Low  Dose 81 Mg Tbec (Aspirin) .... One tab by mouth daily 2)  Toprol Xl 100 Mg Xr24h-tab (Metoprolol succinate) .... One tab by mouth daily 3)  Lisinopril 10 Mg Tabs (Lisinopril) .... One tab by mouth daily 4)  Simvastatin 40 Mg Tabs (Simvastatin) .... One half tab by mouth daily 5)  D 1000 1000 Unit Tabs (Cholecalciferol) .... One tab by mouth daily 6)  Boniva 3 Mg/41ml Kit (Ibandronate sodium) .... Inj q 3 months 7)  Calcium Antacid 500 Mg Chew (Calcium carbonate antacid) .... 4 per day 8)  Terbinafine Hcl 250 Mg Tabs (Terbinafine hcl) .... One tab by mouth daily x 12 weeks 9)  Flonase 50 Mcg/act Susp (Fluticasone propionate) .... One spray in each nostril two times a day even when not having symptoms. 10)  Primidone 50 Mg Tabs (Primidone) .... One half tab by mouth qhs  Patient Instructions: 1)  Pleasure to meet you today. 2)  Please schedule a follow-up appointment in 1-2 weeks in the GERIATRIC CLINIC.   Geriatric Assessment:  Activities of Daily Living:    Bathing-independent    Dressing-independent    Eating-independent    Toileting-independent    Transferring-independent    Continence-independent Overall Assessment: independent  Instrumental Activities of Daily Living:    Transportation-dependent    Meal/Food Preparation-assisted    Shopping Errands-assisted    Housekeeping/Chores-assisted    Money Management/Finances-independent    Medication Management-independent    Ability to Use Telephone-independent    Laundry-independent    Driving limited by vision--does short trips, calls on daughter and neighbor as needed.  Has cataract surgery tomorrow. Overall Assessment: partially dependent  Mental Status Exam: (value/max value)    Orientation to Time: 5/5    Orientation to Place: 5/5    Registration: 3/3    Attention/Calculation: 5/5    Recall: 3/3    Language-name 2 objects: 2/2    Language-repeat: 1/1    Language-follow 3-step command: 3/3    Language-read and follow  direction: 1/1    Write a sentence: 1/1    Copy design: 1/1    DLROW MSE Total score: 30/30

## 2010-09-18 NOTE — Assessment & Plan Note (Signed)
 Summary: flu s/s per dtr/[place]   Vital Signs:  Patient profile:   77 year old female Height:      63 inches Weight:      105.25 pounds Temp:     99.1 degrees F oral Pulse rate:   84 / minute BP sitting:   149 / 76  (right arm)  Vitals Entered By: Arland Morel (May 02, 2009 11:32 AM)  CC: URI-HEADACHE-BODYACHES Is Patient Diabetic? No Pain Assessment Patient in pain? yes     Location: BODY ACHES Intensity: 8   Primary Care Provider:  Debby Petties, M.D.  CC:  URI-HEADACHE-BODYACHES.  History of Present Illness: 78F with several day hx cough, ST, body aches, runny nose.  Able to tolerate by mouth, no N/V/D/C, no neck stiffness, no rash, mildly swollen neck glands, no exposure to strep.  Red Flags STD exposure: No Breathing difficulty: No Drooling: No Trismus: No    Habits & Providers  Alcohol-Tobacco-Diet     Tobacco Status: current     Cigarette Packs/Day: 0.75  Allergies: No Known Drug Allergies  Past History:  Past Medical History: Last updated: 02/17/2009 HTN HLD CAD s/p stent in 2001 MI in 2001 Orthostatic Hypotension Hx Urinary retention in 1993 Hx Pancreatitis in 1994 Osteoporosis  Past Surgical History: Last updated: 12/16/2008 Bilateral Tubal Ligation CAD s/p stenting in 2001 by Dr. Tawana at [facility] Cholecystectomy  Family History: Last updated: 12/16/2008 Heart disease in both maternal and paternal sides. Mother had a stroke. Hx Bone cancer in family  Social History: Last updated: 12/16/2008 Lives at home alone, daughter Dorthea Dene Boers very helpful Does all ADLs and IADLs independently. Likes to go bowling and play computer games. Completed High School Drives a car  Social History: Packs/Day:  0.75  Review of Systems       See HPI  Physical Exam  General:  Well-developed,well-nourished,in no acute distress; alert,appropriate and cooperative throughout examination Head:  Normocephalic and  atraumatic without obvious abnormalities.  Eyes:  No corneal or conjunctival inflammation noted. EOMI. Perrla. Ears:  External ear exam shows no significant lesions or deformities.   Nose:  External nasal examination shows no deformity or inflammation.  Mouth:  Oral mucosa and oropharynx without lesions or exudates, mildly erythematous.  Teeth in good repair. Neck:  Mild LAD Chest Wall:  No deformities, masses, or tenderness noted. Lungs:  Normal respiratory effort, chest expands symmetrically. Lungs are clear to auscultation, no crackles or wheezes. Heart:  Normal rate and regular rhythm. S1 and S2 normal without gallop, murmur, click, rub or other extra sounds.   Impression & Recommendations:  Problem # 1:  UPPER RESPIRATORY INFECTION, VIRAL (ICD-465.9) Assessment New Symptomatic treatment, mucinex, hydration, RTC if not better in 2-3 weeks.  Her updated medication list for this problem includes:    Aspirin  Ec Low Dose 81 Mg Tbec (Aspirin ) ..... One tab by mouth daily  Orders: [facility]- Est Level  3 (00786)  Complete Medication List: 1)  Aspirin  Ec Low Dose 81 Mg Tbec (Aspirin ) .... One tab by mouth daily 2)  Toprol  Xl 100 Mg Xr24h-tab (Metoprolol  succinate) 3)  Lisinopril  10 Mg Tabs (Lisinopril ) .... One tab by mouth daily 4)  Simvastatin  40 Mg Tabs (Simvastatin ) .... One half tab by mouth daily 5)  Propranolol Hcl 40 Mg Tabs (Propranolol hcl) .... 2 tabs by mouth bid 6)  Mirtazapine  15 Mg Tbdp (Mirtazapine ) .... One tab by mouth qhs 7)  Cyclobenzaprine Hcl 5 Mg Tabs (Cyclobenzaprine hcl) .... One tab  by mouth qhs 8)  D 1000 1000 Unit Tabs (Cholecalciferol ) .... One tab by mouth daily 9)  Boniva  3 Mg/53ml Kit (Ibandronate  sodium) .... Inj q 3 months 10)  Calcium Antacid 500 Mg Chew (Calcium carbonate antacid) .... 4 per day  Patient Instructions: 1)  Great to see you today, 2)  You have a viral upper respiratory infection. These are self limited and go away on their own.  Be sure to  keep very well hydrated, use over the counter mucinex 1200mg  two times a day.  Do not use Mucinex-D. 3)  Come back to see me if not starting to get better in 2 weeks. 4)  -Dr. ONEIDA.

## 2010-09-18 NOTE — Assessment & Plan Note (Signed)
Summary: boniva inf,df   Vital Signs:  Patient profile:   77 year old female Height:      63 inches Weight:      109.7 pounds BMI:     19.50 Pulse rate:   66 / minute BP sitting:   133 / 72  (left arm)  Vitals Entered By: Arlyss Repress CMA, (September 05, 2009 8:36 AM) CC: Boniva IV Is Patient Diabetic? No Pain Assessment Patient in pain? no        Primary Care Provider:  Rodney Langton, M.D.  CC:  Boniva IV.  History of Present Illness: DIscussed course of bisphos therapy, generally recommendations are now for 5 years.  She was on by mouth Fosamax for 2, so will continue provided that she has no complications for 3 more years.  Reviewed Bone Density from 5/10, T scores of -2.0 at femur, and -2.9 at lumbar spine.  She denies any acute back pain or upper leg pain.  Is keeping up with supplemental Ca++ and Vitamin D.  Reviewed Creatitine was 0.93 in April 2010.  Will repeat with next injection.  Habits & Providers  Alcohol-Tobacco-Diet     Tobacco Status: current     Tobacco Counseling: to quit use of tobacco products     Cigarette Packs/Day: 0.5  Allergies: No Known Drug Allergies  Physical Exam  General:  In no distress, alert and oriented, daughter present.   Impression & Recommendations:  Problem # 1:  OSTEOPOROSIS (ICD-733.00)  IV Boniva given 3mg , excellent blood return and no complications at the site.  Patient tolerated procedure well. Her updated medication list for this problem includes:    D 1000 1000 Unit Tabs (Cholecalciferol) ..... One tab by mouth daily    Boniva 3 Mg/51ml Kit (Ibandronate sodium) ..... Inj q 3 months  Orders: Provider Misc Charge- [facility] (Misc)  Complete Medication List: 1)  Aspirin Ec Low Dose 81 Mg Tbec (Aspirin) .... One tab by mouth daily 2)  Toprol Xl 100 Mg Xr24h-tab (Metoprolol succinate) 3)  Lisinopril 10 Mg Tabs (Lisinopril) .... One tab by mouth daily 4)  Simvastatin 40 Mg Tabs (Simvastatin) .... One half tab by  mouth daily 5)  Propranolol Hcl 80 Mg Tabs (Propranolol hcl) .... One tab by mouth bid 6)  Mirtazapine 15 Mg Tbdp (Mirtazapine) .... One tab by mouth qhs 7)  Cyclobenzaprine Hcl 5 Mg Tabs (Cyclobenzaprine hcl) .... One tab by mouth qhs 8)  D 1000 1000 Unit Tabs (Cholecalciferol) .... One tab by mouth daily 9)  Boniva 3 Mg/58ml Kit (Ibandronate sodium) .... Inj q 3 months 10)  Calcium Antacid 500 Mg Chew (Calcium carbonate antacid) .... 4 per day  Patient Instructions: 1)  April apt with primary MD and Humberto Seals for [facility]

## 2010-09-18 NOTE — Assessment & Plan Note (Signed)
 Summary: FU/KH   Vital Signs:  Patient profile:   77 year old female Height:      63 inches Weight:      108 pounds BMI:     19.20 Temp:     98 degrees F Pulse rate:   72 / minute BP sitting:   135 / 77  Vitals Entered By: Nathanel Saba RN (June 14, 2009 10:34 AM) CC: Follow up rib fracture Is Patient Diabetic? No Pain Assessment Patient in pain? no        Primary Care Provider:  Debby Petties, M.D.  CC:  Follow up rib fracture.  History of Present Illness: Seen 10/6 for rib fx, rx'ed rib belt, has improved significantly since then, has been able to return to bowling, doesn't need belt anymore, just wears it for bowling but doesn't actually have pain.  No SOB, no cough, no hemoptysis, had swelling on right, posteriorly on 11th rib the size of a baseball but this has since resolved.  No other complaints.  Habits & Providers  Alcohol-Tobacco-Diet     Tobacco Status: current     Tobacco Counseling: to quit use of tobacco products     Cigarette Packs/Day: 0.5  Allergies: No Known Drug Allergies  Social History: Packs/Day:  0.5  Review of Systems       See HPI  Physical Exam  General:  Well-developed,well-nourished,in no acute distress; alert,appropriate and cooperative throughout examination Chest Wall:  Inspection reveals no abnormalities, diaphragmatic excursion and chest expansion normal and symmetric.  No palpable deformities on ANY of her 12 ribs, bilaterally. Lungs:  Normal respiratory effort, chest expands symmetrically. Lungs are clear to auscultation, no crackles or wheezes. Heart:  Normal rate and regular rhythm. S1 and S2 normal without gallop, murmur, click, rub or other extra sounds.   Impression & Recommendations:  Problem # 1:  FRACTURE, RIB, RIGHT (ICD-807.00) Assessment Improved Resolved, no imaging or treatment necessary.  Orders: [facility]- Est Level  3 (00786)  Complete Medication List: 1)  Aspirin  Ec Low Dose 81 Mg Tbec (Aspirin )  .... One tab by mouth daily 2)  Toprol  Xl 100 Mg Xr24h-tab (Metoprolol  succinate) 3)  Lisinopril  10 Mg Tabs (Lisinopril ) .... One tab by mouth daily 4)  Simvastatin  40 Mg Tabs (Simvastatin ) .... One half tab by mouth daily 5)  Propranolol Hcl 40 Mg Tabs (Propranolol hcl) .... 2 tabs by mouth bid 6)  Mirtazapine  15 Mg Tbdp (Mirtazapine ) .... One tab by mouth qhs 7)  Cyclobenzaprine Hcl 5 Mg Tabs (Cyclobenzaprine hcl) .... One tab by mouth qhs 8)  D 1000 1000 Unit Tabs (Cholecalciferol ) .... One tab by mouth daily 9)  Boniva  3 Mg/3ml Kit (Ibandronate  sodium) .... Inj q 3 months 10)  Calcium Antacid 500 Mg Chew (Calcium carbonate antacid) .... 4 per day  Patient Instructions: 1)  Great to see you two today, 2)  Your ribs are just fine, there is no further treatment or diagnostic studies needed.  You don't need to wear the band, but if it helps your bowling, wear it only when you bowl. 3)  Keep hitting those strikes when you bowl. 4)  Come back to see me as needed. 5)  -Dr. ONEIDA.

## 2010-09-18 NOTE — Assessment & Plan Note (Signed)
Summary: adverse drug rxn to primidone- not life threatening   Vital Signs:  Patient profile:   77 year old female Height:      63 inches Weight:      109.9 pounds BMI:     19.54 Temp:     98.1 degrees F oral Pulse rate:   76 / minute BP sitting:   106 / 65  (left arm) Cuff size:   regular  Vitals Entered By: Gladstone Pih (Dec 22, 2009 10:11 AM) CC: F/U from 2 days ago, see triage note Is Patient Diabetic? No   Primary Care Provider:  Rodney Langton, M.D.  CC:  F/U from 2 days ago and see triage note.  History of Present Illness: 77yo F here for f/u w/ c/o weakness and somnolence after starting the primidone  Adverse drug rxn: Pt was started on Primidone 2 days ago and dtr reports that she became more somnolent and weak soon thereafter.  No fevers, chills, N/V, facial droop, unilateral weakness, or dysarthria.  Dtr stopped the medication yesterday and noticed a slight improvement today.  No falls or syncopal events.  Habits & Providers  Alcohol-Tobacco-Diet     Tobacco Status: current     Tobacco Counseling: to quit use of tobacco products     Cigarette Packs/Day: 0.5  Current Medications (verified): 1)  Aspirin Ec Low Dose 81 Mg Tbec (Aspirin) .... One Tab By Mouth Daily 2)  Toprol Xl 100 Mg Xr24h-Tab (Metoprolol Succinate) 3)  Lisinopril 10 Mg Tabs (Lisinopril) .... One Tab By Mouth Daily 4)  Simvastatin 40 Mg Tabs (Simvastatin) .... One Half Tab By Mouth Daily 5)  Mirtazapine 15 Mg Tbdp (Mirtazapine) .... One Tab By Mouth Qhs 6)  Cyclobenzaprine Hcl 5 Mg Tabs (Cyclobenzaprine Hcl) .... One Tab By Mouth Qhs 7)  D 1000 1000 Unit Tabs (Cholecalciferol) .... One Tab By Mouth Daily 8)  Boniva 3 Mg/27ml Kit (Ibandronate Sodium) .... Inj Q 3 Months 9)  Calcium Antacid 500 Mg Chew (Calcium Carbonate Antacid) .... 4 Per Day 10)  Doxycycline Hyclate 100 Mg Tabs (Doxycycline Hyclate) .... One Tab By Mouth Two Times A Day X 7 Days 11)  Terbinafine Hcl 250 Mg Tabs (Terbinafine  Hcl) .... One Tab By Mouth Daily X 12 Weeks 12)  Flonase 50 Mcg/act Susp (Fluticasone Propionate) .... One Spray in Each Nostril Two Times A Day Even When Not Having Symptoms.  Allergies (verified): No Known Drug Allergies  Review of Systems      See HPI  Physical Exam  General:  VS reviewed and rechecked.  Nonorthostatic.  Elderly female, A&O x 3, NAD essential tremor in neck and hands. Head:  normocephalic and atraumatic.   Eyes:  EOMI; asymmetric gaze PERRLA Neck:  supple and full ROM.   Lungs:  Normal respiratory effort, chest expands symmetrically. Lungs are clear to auscultation, no crackles or wheezes. Heart:  Normal rate and regular rhythm. S1 and S2 normal without gallop, murmur, click, rub or other extra sounds. Neurologic:  alert & oriented X3 and cranial nerves II-XII intact.   no facial droop no dysarthia no focal weakness slow and tremulous finger to nose nl alternating movements 5/5 strength all ext nl heel to shin   Impression & Recommendations:  Problem # 1:  ADVERSE DRUG REACTION (ICD-995.20) Assessment New  Non severe, non life threatening adverse drug rxn to the primidone. Seems to be improving since stopping the primidone. Will continue to hold it b/c of falls risk and have her  f/u with Dr. Karie Schwalbe to reassess next week. Orthostatics were nl. No signs of new neurological deficts that would require further imaging.  Orders: [facility]- Est Level  3 (16109)  Problem # 2:  FAILURE TO THRIVE, ADULT (ICD-783.7) Assessment: Comment Only I suspect some degree of mild to moderate dementia. Pt needs a MMSE and possible intervention to maintain independence. Will defer to Dr. Karie Schwalbe  Complete Medication List: 1)  Aspirin Ec Low Dose 81 Mg Tbec (Aspirin) .... One tab by mouth daily 2)  Toprol Xl 100 Mg Xr24h-tab (Metoprolol succinate) 3)  Lisinopril 10 Mg Tabs (Lisinopril) .... One tab by mouth daily 4)  Simvastatin 40 Mg Tabs (Simvastatin) .... One half tab by mouth  daily 5)  Mirtazapine 15 Mg Tbdp (Mirtazapine) .... One tab by mouth qhs 6)  Cyclobenzaprine Hcl 5 Mg Tabs (Cyclobenzaprine hcl) .... One tab by mouth qhs 7)  D 1000 1000 Unit Tabs (Cholecalciferol) .... One tab by mouth daily 8)  Boniva 3 Mg/68ml Kit (Ibandronate sodium) .... Inj q 3 months 9)  Calcium Antacid 500 Mg Chew (Calcium carbonate antacid) .... 4 per day 10)  Doxycycline Hyclate 100 Mg Tabs (Doxycycline hyclate) .... One tab by mouth two times a day x 7 days 11)  Terbinafine Hcl 250 Mg Tabs (Terbinafine hcl) .... One tab by mouth daily x 12 weeks 12)  Flonase 50 Mcg/act Susp (Fluticasone propionate) .... One spray in each nostril two times a day even when not having symptoms.  Patient Instructions: 1)  Schedule f/u with Dr. Karie Schwalbe early next week to discuss medication for tremors. 2)  Stop taking the primidone.

## 2010-09-18 NOTE — Miscellaneous (Signed)
Summary: [name]  Clinical Lists Changes  Problems: Added new problem of [name] (ICD-305.1) 

## 2010-09-18 NOTE — Progress Notes (Signed)
Summary: triage  Phone Note Call from Patient Call back at [phone]   Caller: daughter-Cindy Summary of Call: changed meds and got vaccines and not doing well today - needs to talk to nurse Initial call taken by: De Nurse,  Dec 21, 2009 1:58 PM  Follow-up for Phone Call        her mom was in yesterday. taking doxy for toe infection. new med primadone. got TDap & PNA shot.  states her head does not feel right but unable to explain it better. sleeping alot. ate cereal this am but went back to bed. c/o feeling cold. no fever. dtr is quite concerned. told her I will check with a doctor & call her back Follow-up by: Golden Circle RN,  Dec 21, 2009 2:16 PM  Additional Follow-up for Phone Call Additional follow up Details #1::        per Dr. Amaryllis Dyke, stop the primadone & be seen in clinic tomorrow. spoke with dtr & relayed this. told her to call 911 if her mom became unarousable or was more confused or anything that was not normal for her. she agreed Additional Follow-up by: Golden Circle RN,  Dec 21, 2009 2:28 PM    Additional Follow-up for Phone Call Additional follow up Details #2::    Noted, needs to be seen tomo morning ASAP for this.  Also if temp >101.59F, SOB, presyncope, needs to be seen ASAP @ [facility] or ER.   Follow-up by: Rodney Langton MD,  Dec 21, 2009 4:17 PM  Additional Follow-up for Phone Call Additional follow up Details #3:: Details for Additional Follow-up Action Taken: dtr states they have a 9:45am appt this am Additional Follow-up by: Golden Circle RN,  Dec 22, 2009 8:36 AM

## 2010-09-18 NOTE — Assessment & Plan Note (Signed)
 Summary: NEW PT/KH   Vital Signs:  Patient profile:   77 year old female Weight:      102.1 pounds (46.41 kg) Temp:     97.9 degrees F (36.61 degrees C) oral Pulse rate:   86 / minute BP sitting:   170 / 90  (left arm)  Vitals Entered By: Letitia Reusing (December 16, 2008 9:47 AM) Flex Sig Next Due:  Not Indicated Colonoscopy Next Due:  Not Indicated Hemoccult Next Due:  Not Indicated PAP Next Due:  Not Indicated Mammogram Next Due:  Not Indicated  CC: New Pt Is Patient Diabetic? No   History of Present Illness: 3 WF with HTN, HLD, CAD s/p stent, and c/o dizziness on standing and tremor in neck/head presents for initiation of care with me.  HTN:  Takes Toprol  XL 50 and Lisinopril .  BP elevated today even on manual recheck by me.  No HA, visual changes, CP, SOB, LE swelling.    HLD:  Takes zocor  40 (1/2 tab) daily.  Thinks that taking this medicine makes her dizzy so cut back from 40mg  tab  to 1/2 tab daily.  Only minor change noted in dizziness on standing.  CAD:  Had MI in 2001, plain stent placed by Dr. Tawana at [facility].  Took plavix for a while, now just on ASA 81.  No episodes CP or SOB or LE swelling since.  Dizziness on standing:  Occurs when she gets up from bed in the morning, or stands up too quickly.  Gets dizzy, feels like she's about to pass out, hands become clammy, has to sit down again.  No associated CP, SOB, no syncope, no LE swelling.  No numbness or tingling or weakness during these episodes.  They resolve spontaneously.  Tremor:  Has been going on for decades, approx 3hz  tremor in head, shaking, causes neck and trapezius pain occasionally.  No pill rolling tremor, gait normal, not shuffling or broad based, no incontinence.  Tremor worse with stress.  Habits & Providers     Alcohol drinks/day: <1     Alcohol type: wine     Tobacco Status: current     Tobacco Counseling: to quit use of tobacco products     Year Started: 1950     Does Patient  Exercise: yes     Type of exercise: Walking, Bowling     Exercise (avg: min/session): 30-60     Times/week: <3     STD Risk: never     Seat Belt Use: always     Sun Exposure: infrequent     Cardiologist: Dr. Tawana, [facility]  Past History:  Past Medical History:    HTN    HLD    CAD s/p stent in 2001    MI in 2001    Orthostatic Hypotension    Hx Urinary retention in 1993    Hx Pancreatitis in 1994  Past Surgical History:    Bilateral Tubal Ligation    CAD s/p stenting in 2001 by Dr. Tawana at [facility]    Cholecystectomy  Family History:    Heart disease in both maternal and paternal sides.    Mother had a stroke.    Hx Bone cancer in family  Social History:    Lives at home alone, daughter Dorthea Dene Boers very helpful    Does all ADLs and IADLs independently.    Likes to go bowling and play computer games.    Completed High School  Drives a car    Smoking Status:  current    Does Patient Exercise:  yes    Seat Belt Use:  always    STD Risk:  never    Sun Exposure-Excessive:  infrequent  Review of Systems       See HPI  Physical Exam  General:  Well-developed,well-nourished,in no acute distress; alert,appropriate and cooperative throughout examination Head:  Normocephalic and atraumatic without obvious abnormalities. 3hz  tremor noted Eyes:  No corneal or conjunctival inflammation noted. EOMI. Perrla. Ears:  External ear exam shows no significant lesions or deformities.   Nose:  External nasal examination shows no deformity or inflammation.  Mouth:  Oral mucosa and oropharynx without lesions or exudates.   Neck:  No deformities, masses, or tenderness noted. Chest Wall:  No deformities, masses, or tenderness noted. Lungs:  Normal respiratory effort, chest expands symmetrically. Lungs are clear to auscultation, no crackles or wheezes. Heart:  Normal rate and regular rhythm. S1 and S2 normal without gallop, murmur,  click, rub or other extra sounds. Abdomen:  Bowel sounds positive,abdomen soft and non-tender without masses, organomegaly or hernias noted. Extremities:  No clubbing, cyanosis, edema, or deformity noted with normal full range of motion of all joints.   Neurologic:  Grossly non-focal Skin:  Intact without suspicious lesions or rashes   Impression & Recommendations:  Problem # 1:  HYPERTENSION, ESSENTIAL (ICD-401.9) Assessment New Elevated.  Will be adding propranolol for control of essential tremor, this should help BP somewhat.  If BP still elevated will increase metoprolol  XL to 100mg  daily at next visit in 2 weeks.  Will also check the below labs.  Her updated medication list for this problem includes:    Toprol  Xl 50 Mg Xr24h-tab (Metoprolol  succinate) ..... One tab by mouth daily    Lisinopril  10 Mg Tabs (Lisinopril ) ..... One tab by mouth daily    Propranolol Hcl 40 Mg Tabs (Propranolol hcl) ..... One tab by mouth bid  Orders: Comp Met-[facility] [phone]) CBC-[facility] (14972) TSH-[facility] [phone])  Problem # 2:  HYPERLIPIDEMIA (ICD-272.4) Assessment: New Taking zocor .  Will check lipids to establish baseline.  Her updated medication list for this problem includes:    Simvastatin  40 Mg Tabs (Simvastatin ) ..... One half tab by mouth daily  Orders: Lipid-[facility] ([id number])  Problem # 3:  TREMOR, ESSENTIAL (ICD-333.1) Assessment: New Likely essential, with difficulty gaining weight will check TSH.  Will also start propranolol.  This will help BP and tremor.  Opt to continue metoprolol  as well. If no benefit with propranolol, can try primidone . Reassess at next visit.  Orders: TSH-[facility] ([id number])  Problem # 4:  NECK AND BACK PAIN (ICD-723.1) Assessment: New Likely 2/2 neck tremor, at her age however and with her risk factors, thin, white, 77 years old, will check Bone Density.  Recommended heating pads, tylenol, icy/hot balm, massage.  Her updated medication list for this  problem includes:    Aspirin  Ec Low Dose 81 Mg Tbec (Aspirin ) ..... One tab by mouth daily  Orders: Dexa scan (Dexa scan)  Problem # 5:  ORTHOSTATIC DIZZINESS (ICD-780.4) Unlikely anginal equivalent, symptoms strongly suggestive of orthostatic hypotension.  Very common in her age group.  Recommended compression stockings.  Pt will get these and RTC 2 weeks to reassess.  Also recommended 8 glasses of water each day.  Orders: Comp Met-[facility] [phone]) CBC-[facility] (14972) TSH-[facility] [phone])  Problem # 6:  CORONARY ARTERY DISEASE (ICD-414.00) Assessment: New S/p stenting in 2001.  No episodes of CP since.  Will  risk stratify with lipid panel.  Goals will be to normalize BP and keep cholesterol under control.  Her updated medication list for this problem includes:    Aspirin  Ec Low Dose 81 Mg Tbec (Aspirin ) ..... One tab by mouth daily    Toprol  Xl 50 Mg Xr24h-tab (Metoprolol  succinate) ..... One tab by mouth daily    Lisinopril  10 Mg Tabs (Lisinopril ) ..... One tab by mouth daily    Propranolol Hcl 40 Mg Tabs (Propranolol hcl) ..... One tab by mouth bid  Orders: Lipid-[facility] ([id number]) CBC-[facility] (14972) TSH-[facility] ([id number])  Complete Medication List: 1)  Aspirin  Ec Low Dose 81 Mg Tbec (Aspirin ) .... One tab by mouth daily 2)  Toprol  Xl 50 Mg Xr24h-tab (Metoprolol  succinate) .... One tab by mouth daily 3)  Lisinopril  10 Mg Tabs (Lisinopril ) .... One tab by mouth daily 4)  Simvastatin  40 Mg Tabs (Simvastatin ) .... One half tab by mouth daily 5)  Propranolol Hcl 40 Mg Tabs (Propranolol hcl) .... One tab by mouth bid  Patient Instructions: 1)  Great to see you today, 2)  Go to the lab to check TSH, CBC, CMET, Lipid panel.  I also want to check your bone density. 3)  I want you to go to your pharmacy, or any pharmacy and get some knee high compression stockings to help prevent dizziness on standing. 4)  We will start a low dose of propranolol 40mg  PO BID to help with your tremor. 5)   Come back to see me in 2 weeks to reassess your blood pressure and tremor and go over your labs. 6)  -Dr. ONEIDA. Prescriptions: PROPRANOLOL HCL 40 MG TABS (PROPRANOLOL HCL) One tab by mouth BID  #60 x 6   Entered and Authorized by:   Debby Petties MD   Signed by:   Debby Petties MD on 12/16/2008   Method used:   Electronically to        [facility] [phone]* (retail)       [address] [place], [place]  [address]       Ph: [phone]       Fax: [phone]   RxID:   [id number]    Geriatric Assessment:  Activities of Daily Living:    Bathing-independent    Dressing-independent    Eating-independent    Toileting-independent    Transferring-independent    Continence-independent  Instrumental Activities of Daily Living:    Transportation-independent    Meal/Food Preparation-independent    Shopping Errands-independent    Housekeeping/Chores-independent    Money Management/Finances-independent    Medication Management-independent    Ability to Use Telephone-independent    Laundry-independent  Balance: (value/max value)    Sitting balance: 1/1    Arises: 2/2    Attempts to arise: 2/2    Immediate standing balance: 2/2    Standing balance: 1/1    Nudged: 2/2    Eyes closed: 1/1    360 degree turn: 1/1    Sitting down: 2/2 Balance Total Score: 14/14  Gait: (value/max value)    Initiation of gait: 1/1    Step length-left: 1/1    Step height-left: 1/1    Step length-right: 1/1    Step height-right: 1/1    Step symmetry: 1/1    Step continuity: 1/1    Path: 2/2    Trunk: 2/2    Walking stance: 1/1 Gait Total Score: 12/12  Balance + Gait Total Score: 26/26

## 2010-09-18 NOTE — Consult Note (Signed)
Summary: [facility]  [facility]   Imported By: Clydell Hakim 05/16/2010 16:27:59  _____________________________________________________________________  External Attachment:    Type:   Image     Comment:   External Document

## 2010-09-18 NOTE — Assessment & Plan Note (Signed)
Summary: f/u per olson/kh   Vital Signs:  Patient profile:   77 year old female Height:      63 inches Weight:      104.4 pounds BMI:     18.56 Temp:     98.0 degrees F oral Pulse rate:   66 / minute BP sitting:   114 / 70  (left arm)  Vitals Entered By: Jimmy Footman, CMA (February 15, 2010 9:16 AM) CC: f/u bloodwork, loss of weight Is Patient Diabetic? No Pain Assessment Patient in pain? no        Primary Care Provider:  Rodney Langton, M.D.  CC:  f/u bloodwork and loss of weight.  History of Present Illness: Ms. Jankovich comes in to follow-up weight loss, depression, recent labwork. 1) Weight loss - Feels she is losing weight.  Attributes this to poor appetite which she attributes to change in taste as well as just not feeling hungry like she used to.  Feels like she smells okay.  Used to take mirtazapine for this but stopped it last month because she thought it made her groggy.  Looking through records, started on Mirtazapine 5/10.  Rx ran out in 5/11.  was out a few weeks before restarting it again. Took it once and felt funny so didn't take it again.  Reviwed weights: May 2010 - 101# - Mirtazapine started May 2011 - up to 111- but mirtazapine stopped Today - down to 104# (off of the mirtazapine)  Was on mirtazapine 15 and restarted at that dose last month when she felt funny.  Taste seemed to chage while on Terbinafine.  Has stopped that for about 2 weeks and taste seems to be returning to normal. Still not much appetite though. 2) Depression - says she may have been depressed before but she is feeling much better now.  Given Geriatric Depression Screen during visit and only had 2 positives out of 15 (5 or more is suggestive of depression).  Problem seems to be that her family "babies" her too - they won't let her do things or help out more.  3) Labwork - CBC, TSH essentially norml and patient informed of this.   Habits & Providers  Alcohol-Tobacco-Diet     Tobacco Status:  current     Cigarette Packs/Day: 0.5  Allergies: No Known Drug Allergies  Physical Exam  General:  thin, alert, NAD vitals reviewed Eyes:  amblyopia of R eye  Mouth:  MMM, OP clear Abdomen:  soft, nontender, nondistendedc Msk:  no rigidty of UEs Neurologic:  Narrow-based gait. Prolonged turn around Good balance, even with confrontation (nudge) Negative romberg No tremor with finger to nose  Psych:  Oriented X3, memory intact for recent and remote, normally interactive, good eye contact, not anxious appearing, and not depressed appearing.     Impression & Recommendations:  Problem # 1:  MOOD DISORDER (ICD-296.90) Assessment Improved  Improved.  NOt depressed appearing today.  Encouraged more socialization and encouraaged daughter to let her mother participate more in family chores, activities.   Orders: [facility]- Est Level  3 (16109)  Problem # 2:  FAILURE TO THRIVE, ADULT (ICD-783.7)  Did well on remeron.  Weight loss since off of it.  Will restart but at lower dose, 7.5mg , and can titrate up to 15 mg in a few weeks if indicated.  F/U with Dr. Karie Schwalbe.  Taste changes is side effect of terbinafine.  Since improving since stopping terbinafine, was likely due to this.   Orders: [facility]- Est  Level  3 (99213)  Complete Medication List: 1)  Aspirin Ec Low Dose 81 Mg Tbec (Aspirin) .... One tab by mouth daily 2)  Toprol Xl 100 Mg Xr24h-tab (Metoprolol succinate) .... One tab by mouth daily 3)  Lisinopril 10 Mg Tabs (Lisinopril) .... One tab by mouth daily 4)  Simvastatin 40 Mg Tabs (Simvastatin) .... One half tab by mouth daily 5)  D 1000 1000 Unit Tabs (Cholecalciferol) .... One tab by mouth daily 6)  Boniva 3 Mg/70ml Kit (Ibandronate sodium) .... Inj q 3 months 7)  Calcium Antacid 500 Mg Chew (Calcium carbonate antacid) .... 4 per day 8)  Flonase 50 Mcg/act Susp (Fluticasone propionate) .... One spray in each nostril two times a day even when not having symptoms. 9)  Primidone 50 Mg Tabs  (Primidone) .... One half tab by mouth qhs 10)  Mirtazapine 7.5 Mg Tabs (Mirtazapine) .Marland Kitchen.. 1 tab by mouth qhs  Patient Instructions: 1)  It was a pleasure to meet you today.  Please schedule a follow-up appointment with Dr. Karie Schwalbe in 3-4 weeks to see how you are doing on the lower dose of mirtazapine. 2)  Enjoy the holiday weekend! Prescriptions: MIRTAZAPINE 7.5 MG TABS (MIRTAZAPINE) 1 tab by mouth qHS  #30 x 3   Entered and Authorized by:   Ardeen Garland  MD   Signed by:   Ardeen Garland  MD on 02/15/2010   Method used:   Print then Give to Patient   RxID:   [id number] MIRTAZAPINE 7.5 MG TABS (MIRTAZAPINE) 1 tab by mouth qHS  #30 x 3   Entered by:   Ardeen Garland  MD   Authorized by:   Zachery Dauer MD   Signed by:   Ardeen Garland  MD on 02/15/2010   Method used:   Print then Give to Patient   RxID:   [id number] MIRTAZAPINE 7.5 MG TABS (MIRTAZAPINE) 1 tab by mouth qHS  #30 x 3   Entered by:   Ardeen Garland  MD   Authorized by:   Zachery Dauer MD   Signed by:   Ardeen Garland  MD on 02/15/2010   Method used:   Print then Give to Patient   RxID:   [id number]

## 2010-09-18 NOTE — Assessment & Plan Note (Signed)
Summary: FU/KH   Vital Signs:  Patient profile:   77 year old female Weight:      110.7 pounds Temp:     97.8 degrees F oral Pulse rate:   64 / minute Pulse rhythm:   regular BP sitting:   144 / 80  (left arm) Cuff size:   regular  Vitals Entered By: Loralee Pacas CMA (Dec 20, 2009 2:15 PM) CC: follow-up visit Comments left great toe red and irritated x 2 weeks, wanted meds for runny nose   Primary Care Provider:  Rodney Langton, M.D.  CC:  follow-up visit.  History of Present Illness: Pt with several complaints:  Essential Tremor:  Has been using propranolol, symptoms improved but still present and bothering patient.  Interested in other options.  Right great toe very tender around toenail.  Has been using Epsom salt baths.  Runny nose, since last year, doesnt change particularly with seasons, clear, no itcy eyes, no cough, no ST, no fevers.  Habits & Providers  Alcohol-Tobacco-Diet     Tobacco Status: current     Tobacco Counseling: to quit use of tobacco products     Cigarette Packs/Day: 0.5  Current Medications (verified): 1)  Aspirin Ec Low Dose 81 Mg Tbec (Aspirin) .... One Tab By Mouth Daily 2)  Toprol Xl 100 Mg Xr24h-Tab (Metoprolol Succinate) 3)  Lisinopril 10 Mg Tabs (Lisinopril) .... One Tab By Mouth Daily 4)  Simvastatin 40 Mg Tabs (Simvastatin) .... One Half Tab By Mouth Daily 5)  Primidone 50 Mg Tabs (Primidone) .... One Tab By Mouth Qhs 6)  Mirtazapine 15 Mg Tbdp (Mirtazapine) .... One Tab By Mouth Qhs 7)  Cyclobenzaprine Hcl 5 Mg Tabs (Cyclobenzaprine Hcl) .... One Tab By Mouth Qhs 8)  D 1000 1000 Unit Tabs (Cholecalciferol) .... One Tab By Mouth Daily 9)  Boniva 3 Mg/67ml Kit (Ibandronate Sodium) .... Inj Q 3 Months 10)  Calcium Antacid 500 Mg Chew (Calcium Carbonate Antacid) .... 4 Per Day 11)  Doxycycline Hyclate 100 Mg Tabs (Doxycycline Hyclate) .... One Tab By Mouth Two Times A Day X 7 Days 12)  Terbinafine Hcl 250 Mg Tabs (Terbinafine Hcl)  .... One Tab By Mouth Daily X 12 Weeks 13)  Flonase 50 Mcg/act Susp (Fluticasone Propionate) .... One Spray in Each Nostril Two Times A Day Even When Not Having Symptoms.  Allergies (verified): No Known Drug Allergies  Past History:  Past Surgical History: Last updated: 12/16/2008 Bilateral Tubal Ligation CAD s/p stenting in 2001 by Dr. Ria Bush at [facility] Cholecystectomy  Past Medical History: HTN HLD CAD s/p stent in 2001 MI in 2001 Orthostatic Hypotension Hx Urinary retention in 1993 Hx Pancreatitis in 1994 Osteoporosis Onychomycosis in toenails. Essential Tremor  Review of Systems       See HPI  Physical Exam  General:  Well-developed,well-nourished,in no acute distress; alert,appropriate and cooperative throughout examination.  3hz  essential tremor in neck and hands. Lungs:  Normal respiratory effort, chest expands symmetrically. Lungs are clear to auscultation, no crackles or wheezes. Heart:  Normal rate and regular rhythm. S1 and S2 normal without gallop, murmur, click, rub or other extra sounds. Extremities:  Right great toenail yellow, thick, discolored.  Every tender surrounding, erythematous, no loculations or fluctuant collections palpable.  No purulence.   Impression & Recommendations:  Problem # 1:  ALLERGIC RHINITIS, CHRONIC (ICD-477.9) Assessment New See below.  Her updated medication list for this problem includes:    Flonase 50 Mcg/act Susp (Fluticasone propionate) ..... One spray  in each nostril two times a day even when not having symptoms.  Problem # 2:  ONYCHOMYCOSIS, TOENAILS (ICD-110.1) Assessment: New Checking LFTs then Lamisil x 12 weeks.  She also has an element of infection/peronychia.  There is no abscess to drain so will tx with doxy x 7 days to cover MRSA.  Her updated medication list for this problem includes:    Terbinafine Hcl 250 Mg Tabs (Terbinafine hcl) ..... One tab by mouth daily x 12 weeks    Doxycycline  100 mg by mouth two times a day x 7 days.  Orders: Hepatic-[facility] ([id number]) [facility]- Est  Level 4 (09811)  Problem # 3:  TREMOR, ESSENTIAL (ICD-333.1) Assessment: Unchanged Switched meds from propranolol to primidone.  Will start low (50mg ) and titrate up.  Problem # 4:  Preventive Health Care (ICD-V70.0) Assessment: Comment Only TDaP and Pneumococcal vaccine today.  Complete Medication List: 1)  Aspirin Ec Low Dose 81 Mg Tbec (Aspirin) .... One tab by mouth daily 2)  Toprol Xl 100 Mg Xr24h-tab (Metoprolol succinate) 3)  Lisinopril 10 Mg Tabs (Lisinopril) .... One tab by mouth daily 4)  Simvastatin 40 Mg Tabs (Simvastatin) .... One half tab by mouth daily 5)  Primidone 50 Mg Tabs (Primidone) .... One tab by mouth qhs 6)  Mirtazapine 15 Mg Tbdp (Mirtazapine) .... One tab by mouth qhs 7)  Cyclobenzaprine Hcl 5 Mg Tabs (Cyclobenzaprine hcl) .... One tab by mouth qhs 8)  D 1000 1000 Unit Tabs (Cholecalciferol) .... One tab by mouth daily 9)  Boniva 3 Mg/51ml Kit (Ibandronate sodium) .... Inj q 3 months 10)  Calcium Antacid 500 Mg Chew (Calcium carbonate antacid) .... 4 per day 11)  Doxycycline Hyclate 100 Mg Tabs (Doxycycline hyclate) .... One tab by mouth two times a day x 7 days 12)  Terbinafine Hcl 250 Mg Tabs (Terbinafine hcl) .... One tab by mouth daily x 12 weeks 13)  Flonase 50 Mcg/act Susp (Fluticasone propionate) .... One spray in each nostril two times a day even when not having symptoms.  Other Orders: Tdap => 62yrs IM (91478) Admin 1st Vaccine ([address]562) Pneumococcal Vaccine (13086) Admin of Any Addtl Vaccine (57846)  Patient Instructions: 1)  Pneumovax and TDaP 2)  Flonase for runny nose 3)  checking liver tests and Lamisil x 12 weeks for toenail 4)  Doxycycline x 7 days for painful toe 5)  Primidone for tremor, stop propranolol. 6)  -Dr. Karie Schwalbe. Prescriptions: FLONASE 50 MCG/ACT SUSP (FLUTICASONE PROPIONATE) One spray in each nostril two times a day even when not having  symptoms.  #1 bottle x 6   Entered and Authorized by:   Rodney Langton MD   Signed by:   Rodney Langton MD on 12/20/2009   Method used:   Electronically to        [facility] [phone]* (retail)       [address] [place], [place]  [address]       Ph: [phone]       Fax: [phone]   RxID:   [id number] TERBINAFINE HCL 250 MG TABS (TERBINAFINE HCL) One tab by mouth daily x 12 weeks  #84 x 0   Entered and Authorized by:   Rodney Langton MD   Signed by:   Rodney Langton MD on 12/20/2009   Method used:   Electronically to        [facility] [phone]* (retail)       [address]  [address]       [place], [place]  [address]       Ph: [phone]       Fax: [phone]   RxID:   [id number] DOXYCYCLINE HYCLATE 100 MG TABS (DOXYCYCLINE HYCLATE) One tab by mouth two times a day x 7 days  #14 x 0   Entered and Authorized by:   Rodney Langton MD   Signed by:   Rodney Langton MD on 12/20/2009   Method used:   Electronically to        [facility] [phone]* (retail)       [address] [place], [place]  [address]       Ph: [phone]       Fax: [phone]   RxID:   [id number] PRIMIDONE 50 MG TABS (PRIMIDONE) One tab by mouth qHS  #30 x 0   Entered and Authorized by:   Rodney Langton MD   Signed by:   Rodney Langton MD on 12/20/2009   Method used:   Electronically to        [facility] [phone]* (retail)       [address] [place], [place]  [address]       Ph: [phone]       Fax: [phone]   RxID:   [id number]   Last Flu Vaccine:  Fluvax 3+ (05/24/2009 10:52:07 AM) Flu Vaccine Next Due:  1 yr TD Result Date:  12/20/2009 TD Result:  given TD Next Due:  10 yr Pneumovax Result Date:  12/20/2009 Pneumovax Result:  given Pneumovax Next Due:  5 yr Bone Density Result Date:  12/19/2008 Bone Density Result:  abnormal Bone Density Next Due: Not Indicated Last Creatinine:  1.18  (12/05/2009 6:43:00 PM) Creatinine Next Due: 1 yr Last Potassium:  3.9 (12/05/2009 6:43:00 PM) Potassium Next Due:  1 yr   Immunizations Administered:  Tetanus Vaccine:    Vaccine Type: Tdap    Site: right deltoid    Mfr: GlaxoSmithKline    Dose: 0.5 ml    Route: IM    Given by: Loralee Pacas CMA    Exp. Date: 11/11/2011    Lot #: [id number]    VIS given: 07/07/07 version given Dec 20, 2009.  Pneumonia Vaccine:    Vaccine Type: Pneumovax (Medicare)    Site: right deltoid    Mfr: Merck    Dose: 0.5 ml    Route: IM    Given by: Loralee Pacas CMA    Exp. Date: 07/28/2010    Lot #: [id number]    VIS given: 03/16/96 version given Dec 20, 2009.

## 2010-09-18 NOTE — Assessment & Plan Note (Signed)
Summary: BONIVA/KH   Vital Signs:  Patient profile:   77 year old female Height:      63 inches Weight:      111 pounds BMI:     19.73 Temp:     98.0 degrees F oral Pulse rate:   68 / minute BP sitting:   143 / 72  (right arm) Cuff size:   regular  Vitals Entered By: Tessie Fass CMA (December 05, 2009 9:40 AM) CC: boniva injection Is Patient Diabetic? No Pain Assessment Patient in pain? no        Primary Care Provider:  Rodney Langton, M.D.  CC:  boniva injection.  History of Present Illness: Denies bone pain, has tolerated IVP Boniva.  Discussed options for smoking cessation  Discussed worsening to her benign essental tremor that affect her writing and head bobbing.  Habits & Providers  Alcohol-Tobacco-Diet     Tobacco Status: current     Tobacco Counseling: to quit use of tobacco products     Cigarette Packs/Day: 0.5  Allergies: No Known Drug Allergies  Physical Exam  General:  Head bobbing, no other notible tremor   Impression & Recommendations:  Problem # 1:  OSTEOPOROSIS (ICD-733.00)  IVP Boniva given, no complications Lot [id number], exp 05/12 Her updated medication list for this problem includes:    D 1000 1000 Unit Tabs (Cholecalciferol) ..... One tab by mouth daily    Boniva 3 Mg/64ml Kit (Ibandronate sodium) ..... Inj q 3 months  Orders: Provider Misc Charge- [facility] (Misc)  Problem # 2:  TREMOR, ESSENTIAL (ICD-333.1) Consider trial  of primadone if propanolol not controlling and if tremor is interfering with her life  Complete Medication List: 1)  Aspirin Ec Low Dose 81 Mg Tbec (Aspirin) .... One tab by mouth daily 2)  Toprol Xl 100 Mg Xr24h-tab (Metoprolol succinate) 3)  Lisinopril 10 Mg Tabs (Lisinopril) .... One tab by mouth daily 4)  Simvastatin 40 Mg Tabs (Simvastatin) .... One half tab by mouth daily 5)  Propranolol Hcl 80 Mg Tabs (Propranolol hcl) .... One tab by mouth bid 6)  Mirtazapine 15 Mg Tbdp (Mirtazapine) .... One tab by  mouth qhs 7)  Cyclobenzaprine Hcl 5 Mg Tabs (Cyclobenzaprine hcl) .... One tab by mouth qhs 8)  D 1000 1000 Unit Tabs (Cholecalciferol) .... One tab by mouth daily 9)  Boniva 3 Mg/18ml Kit (Ibandronate sodium) .... Inj q 3 months 10)  Calcium Antacid 500 Mg Chew (Calcium carbonate antacid) .... 4 per day  Other Orders: Basic Met-[facility] ([id number])  Patient Instructions: 1)  Follow up apt when daughter is seen

## 2010-09-18 NOTE — Assessment & Plan Note (Signed)
 Summary: f/u,df   Vital Signs:  Patient profile:   77 year old female Weight:      105 pounds Temp:     98.1 degrees F oral Pulse rate:   63 / minute BP sitting:   145 / 80  (left arm)  Vitals Entered By: Letitia Reusing (February 17, 2009 4:27 PM) CC: f/u Is Patient Diabetic? No   Primary Care Provider:  Debby Petties, M.D.  CC:  f/u.  History of Present Illness: 23F with weight loss, tremors, orthostasis, and osteoporosis comes in for routin fu of these conditions.  Weight loss:  Up to 105 lbs now.  Has been taking mirtazepine and had an increase in appetite.    Tremors:  Completely gone with low dose propranolol.  Neck stiffness and muscular pain resolving now as well.  Orthostasis:  No longer dizzy when getting up.  Osteoporosis:  Recently tolerated Boniva  injection well.  Taking Vit D but not calcium yet.   HTN: Slightly elevated today.  Habits & Providers  Alcohol-Tobacco-Diet     Tobacco Status: never  Allergies: No Known Drug Allergies  Past History:  Past Surgical History: Last updated: 12/16/2008 Bilateral Tubal Ligation CAD s/p stenting in 2001 by Dr. Tawana at [facility] Cholecystectomy  Family History: Last updated: 12/16/2008 Heart disease in both maternal and paternal sides. Mother had a stroke. Hx Bone cancer in family  Social History: Last updated: 12/16/2008 Lives at home alone, daughter Dorthea Dene Boers very helpful Does all ADLs and IADLs independently. Likes to go bowling and play computer games. Completed High School Drives a car  Past Medical History: HTN HLD CAD s/p stent in 2001 MI in 2001 Orthostatic Hypotension Hx Urinary retention in 1993 Hx Pancreatitis in 1994 Osteoporosis  Review of Systems       See HPI  Physical Exam  General:  Well-developed,well-nourished,in no acute distress; alert,appropriate and cooperative throughout examination Neck:  No deformities, masses, or tenderness  noted. Lungs:  Normal respiratory effort, chest expands symmetrically. Lungs are clear to auscultation, no crackles or wheezes. Heart:  Normal rate and regular rhythm. S1 and S2 normal without gallop, murmur, click, rub or other extra sounds.   Impression & Recommendations:  Problem # 1:  FAILURE TO THRIVE, ADULT (ICD-783.7) Assessment Improved Starting to gain weight.  Will continue mirtazepine.  Continue nutritional supplementation.  Orders: [facility]- Est  Level 4 (00785)  Problem # 2:  OSTEOPOROSIS (ICD-733.00) Assessment: Improved Pt to start taking Tums as prescribe for calcium.  Otherwise stable.  Next Boniva  injection  ~04/14/2009.  Her updated medication list for this problem includes:    D 1000 1000 Unit Tabs (Cholecalciferol ) ..... One tab by mouth daily    Boniva  3 Mg/3ml Kit (Ibandronate  sodium) ..... Inj q 3 months  Orders: [facility]- Est  Level 4 (99214)  Problem # 3:  TREMOR, ESSENTIAL (ICD-333.1) Assessment: Improved Resolved.   Propranolol Hcl 40 Mg Tabs (Propranolol hcl) .SABRA... 2 tabs by mouth bid  Orders: [facility]- Est  Level 4 (00785)  Problem # 4:  ORTHOSTATIC DIZZINESS (ICD-780.4) Assessment: Improved Resolved.  Orders: [facility]- Est  Level 4 (99214)  Problem # 5:  HYPERTENSION, ESSENTIAL (ICD-401.9) Assessment: Deteriorated BP slightly elevated today, would opt not to control her BP very tightly as she has a history of orthostasis.  No changes in medication at this time.  Her updated medication list for this problem includes:    Toprol  Xl 100 Mg Xr24h-tab (Metoprolol  succinate)    Lisinopril   10 Mg Tabs (Lisinopril ) ..... One tab by mouth daily  Orders: [facility]- Est  Level 4 (00785)  Complete Medication List: 1)  Aspirin  Ec Low Dose 81 Mg Tbec (Aspirin ) .... One tab by mouth daily 2)  Toprol  Xl 100 Mg Xr24h-tab (Metoprolol  succinate) 3)  Lisinopril  10 Mg Tabs (Lisinopril ) .... One tab by mouth daily 4)  Simvastatin  40 Mg Tabs (Simvastatin ) .... One half tab by mouth  daily 5)  Propranolol Hcl 40 Mg Tabs (Propranolol hcl) .... 2 tabs by mouth bid 6)  Mirtazapine  15 Mg Tbdp (Mirtazapine ) .... One tab by mouth qhs 7)  Cyclobenzaprine Hcl 5 Mg Tabs (Cyclobenzaprine hcl) .... One tab by mouth qhs 8)  D 1000 1000 Unit Tabs (Cholecalciferol ) .... One tab by mouth daily 9)  Boniva  3 Mg/3ml Kit (Ibandronate  sodium) .... Inj q 3 months 10)  Calcium Antacid 500 Mg Chew (Calcium carbonate antacid) .... 4 per day  Patient Instructions: 1)  Great to see you two again. 2)  Keep doing what you are doing.  I have no changes or recommendations.  Come back to see me in 6 months. 3)  -Dr. ONEIDA.

## 2010-09-18 NOTE — Miscellaneous (Signed)
  Clinical Lists Changes  Problems: Removed problem of WEIGHT LOSS, RECENT (ICD-783.21) Removed problem of ADVERSE DRUG REACTION (ICD-995.20) Removed problem of FRACTURE, RIB, RIGHT (ICD-807.00) Removed problem of MOOD DISORDER (ICD-296.90) Removed problem of NECK AND BACK PAIN (ICD-723.1) Removed problem of ORTHOSTATIC DIZZINESS (ICD-780.4)

## 2010-09-18 NOTE — Letter (Signed)
Summary: Lipid Letter  [facility]  [address]   [place], [place] [address]   Phone: [phone]  Fax: [phone]    06/12/2010  [name] [address] [place], [place]  [address]  Dear [name]:  We have carefully reviewed your last lipid profile from 12/16/2008 and the results are noted below with a summary of recommendations for lipid management.    LDL "bad" Cholesterol:   73     Goal: <100    Excellent, no changes needed!!!    TLC Diet (Therapeutic Lifestyle Change): Saturated Fats & Transfatty acids should be kept < 7% of total calories ***Reduce Saturated Fats Polyunstaurated Fat can be up to 10% of total calories Monounsaturated Fat Fat can be up to 20% of total calories Total Fat should be no greater than 25-35% of total calories Carbohydrates should be 50-60% of total calories Protein should be approximately 15% of total calories Fiber should be at least 20-30 grams a day ***Increased fiber may help lower LDL Total Cholesterol should be < 200mg /day Consider adding plant stanol/sterols to diet (example: Benacol spread) ***A higher intake of unsaturated fat may reduce Triglycerides and Increase HDL    Adjunctive Measures (may lower LIPIDS and reduce risk of Heart Attack) include: Aerobic Exercise (20-30 minutes 3-4 times a week) Limit Alcohol Consumption Weight Reduction Aspirin 75-81 mg a day by mouth (if not allergic or contraindicated) Dietary Fiber 20-30 grams a day by mouth     Current Medications: 1)    Aspirin Ec Low Dose 81 Mg Tbec (Aspirin) .... One tab by mouth daily 2)    Toprol Xl 100 Mg Xr24h-tab (Metoprolol succinate) .... One tab by mouth daily 3)    Lisinopril 10 Mg Tabs (Lisinopril) .... One tab by mouth daily 4)    Simvastatin 40 Mg Tabs (Simvastatin) .... One half tab by mouth daily 5)    D 1000 1000 Unit Tabs (Cholecalciferol) .... One tab by mouth daily 6)    Boniva 3 Mg/45ml Kit (Ibandronate sodium)  .... Inj q 3 months 7)    Calcium Antacid 500 Mg Chew (Calcium carbonate antacid) .... 4 per day 8)    Flonase 50 Mcg/act Susp (Fluticasone propionate) .... One spray in each nostril two times a day even when not having symptoms. 9)    Primidone 50 Mg Tabs (Primidone) .... One half tab by mouth qhs 10)    Mirtazapine 7.5 Mg Tabs (Mirtazapine) .... One half  tab by mouth qhs 11)    Grifulvin V 500 Mg Tabs (Griseofulvin microsize) .... 2 tabs by mouth daily x 6 months.  If you have any questions, please call. We appreciate being able to work with you.   Sincerely,    [facility] Rodney Langton MD  Appended Document: Lipid Letter mailed.  Appended Document: Lipid Letter mailed.

## 2010-09-18 NOTE — Assessment & Plan Note (Signed)
Summary: boniva,df   Vital Signs:  Patient profile:   77 year old female Weight:      101 pounds Pulse rate:   74 / minute BP sitting:   132 / 82  (right arm)  Vitals Entered By: Renato Battles slade,cma CC: boniva Is Patient Diabetic? No Pain Assessment Patient in pain? no        Primary Care Provider:  Rodney Langton, M.D.  CC:  boniva.  History of Present Illness: Here for IVP Boniva.  Denies leg pain, aches, recent dental work, or change in medical condition.  Habits & Providers  Alcohol-Tobacco-Diet     Tobacco Status: current     Tobacco Counseling: to quit use of tobacco products  Current Medications (verified): 1)  Aspirin Ec Low Dose 81 Mg Tbec (Aspirin) .... One Tab By Mouth Daily 2)  Toprol Xl 100 Mg Xr24h-Tab (Metoprolol Succinate) .... One Tab By Mouth Daily 3)  Lisinopril 10 Mg Tabs (Lisinopril) .... One Tab By Mouth Daily 4)  Simvastatin 40 Mg Tabs (Simvastatin) .... One Half Tab By Mouth Daily 5)  D 1000 1000 Unit Tabs (Cholecalciferol) .... One Tab By Mouth Daily 6)  Boniva 3 Mg/57ml Kit (Ibandronate Sodium) .... Inj Q 3 Months 7)  Calcium Antacid 500 Mg Chew (Calcium Carbonate Antacid) .... 4 Per Day 8)  Flonase 50 Mcg/act Susp (Fluticasone Propionate) .... One Spray in Each Nostril Two Times A Day Even When Not Having Symptoms. 9)  Primidone 50 Mg Tabs (Primidone) .... One Half Tab By Mouth Qhs 10)  Mirtazapine 7.5 Mg Tabs (Mirtazapine) .... One Half  Tab By Mouth Qhs 11)  Grifulvin V 500 Mg Tabs (Griseofulvin Microsize) .... 2 Tabs By Mouth Daily X 6 Months.  Allergies: No Known Drug Allergies  Review of Systems      See HPI  Physical Exam  General:  Alert, generalized tremor, well groomed.   Impression & Recommendations:  Problem # 1:  OSTEOPOROSIS (ICD-733.00) IVP Boniva given without complications,  Lot # [id number], exp 9/12 Her updated medication list for this problem includes:    D 1000 1000 Unit Tabs (Cholecalciferol) ..... One tab  by mouth daily    Boniva 3 Mg/28ml Kit (Ibandronate sodium) ..... Inj q 3 months  Orders: Provider Misc Charge- [facility] (Misc)  Complete Medication List: 1)  Aspirin Ec Low Dose 81 Mg Tbec (Aspirin) .... One tab by mouth daily 2)  Toprol Xl 100 Mg Xr24h-tab (Metoprolol succinate) .... One tab by mouth daily 3)  Lisinopril 10 Mg Tabs (Lisinopril) .... One tab by mouth daily 4)  Simvastatin 40 Mg Tabs (Simvastatin) .... One half tab by mouth daily 5)  D 1000 1000 Unit Tabs (Cholecalciferol) .... One tab by mouth daily 6)  Boniva 3 Mg/91ml Kit (Ibandronate sodium) .... Inj q 3 months 7)  Calcium Antacid 500 Mg Chew (Calcium carbonate antacid) .... 4 per day 8)  Flonase 50 Mcg/act Susp (Fluticasone propionate) .... One spray in each nostril two times a day even when not having symptoms. 9)  Primidone 50 Mg Tabs (Primidone) .... One half tab by mouth qhs 10)  Mirtazapine 7.5 Mg Tabs (Mirtazapine) .... One half  tab by mouth qhs 11)  Grifulvin V 500 Mg Tabs (Griseofulvin microsize) .... 2 tabs by mouth daily x 6 months.  Patient Instructions: 1)  Please schedule a follow-up appointment in 3 months .    Orders Added: 1)  Provider Misc Charge- [facility] [Misc]

## 2010-09-20 NOTE — Assessment & Plan Note (Signed)
Summary: boniva injection/[facility]   Vital Signs:  Patient profile:   77 year old female Height:      63 inches Weight:      101 pounds BMI:     17.96 Temp:     98.2 degrees F oral BP sitting:   141 / 78  (right arm) Cuff size:   regular  Vitals Entered By: Jimmy Footman, CMA (September 10, 2010 2:39 PM) CC: boniva injection Is Patient Diabetic? No Pain Assessment Patient in pain? no        Primary Care Provider:  Rodney Langton, M.D.  CC:  boniva injection.  History of Present Illness: Here for IVP Boniva  Patient has lost some weight  Allergies: No Known Drug Allergies   Impression & Recommendations:  Problem # 1:  OSTEOPOROSIS (ICD-733.00) Lot [id number]; exp 5/13.  Will need to check renal function before next injection.  Excellent blood return, medication administered IVP with no complications. Her updated medication list for this problem includes:    D 1000 1000 Unit Tabs (Cholecalciferol) ..... One tab by mouth daily    Boniva 3 Mg/38ml Kit (Ibandronate sodium) ..... Inj q 3 months  Orders: Provider Misc Charge- [facility] (Misc) Admin of Therapeutic Inj  intravenous [phone])  Complete Medication List: 1)  Aspirin Ec Low Dose 81 Mg Tbec (Aspirin) .... One tab by mouth daily 2)  Toprol Xl 100 Mg Xr24h-tab (Metoprolol succinate) .... One tab by mouth daily 3)  Lisinopril 10 Mg Tabs (Lisinopril) .... One tab by mouth daily 4)  Simvastatin 40 Mg Tabs (Simvastatin) .... One half tab by mouth daily 5)  D 1000 1000 Unit Tabs (Cholecalciferol) .... One tab by mouth daily 6)  Boniva 3 Mg/49ml Kit (Ibandronate sodium) .... Inj q 3 months 7)  Calcium Antacid 500 Mg Chew (Calcium carbonate antacid) .... 4 per day 8)  Flonase 50 Mcg/act Susp (Fluticasone propionate) .... One spray in each nostril two times a day even when not having symptoms. 9)  Primidone 50 Mg Tabs (Primidone) .... One half tab by mouth qhs 10)  Mirtazapine 7.5 Mg Tabs (Mirtazapine) .... One half  tab by mouth  qhs 11)  Grifulvin V 500 Mg Tabs (Griseofulvin microsize) .... 2 tabs by mouth daily x 6 months.   Orders Added: 1)  [facility] 2)  Admin of Therapeutic Inj  intravenous [id number]

## 2010-11-02 ENCOUNTER — Ambulatory Visit (INDEPENDENT_AMBULATORY_CARE_PROVIDER_SITE_OTHER): Payer: PRIVATE HEALTH INSURANCE | Admitting: Family Medicine

## 2010-11-02 ENCOUNTER — Encounter: Payer: Self-pay | Admitting: Family Medicine

## 2010-11-02 DIAGNOSIS — R109 Unspecified abdominal pain: Secondary | ICD-10-CM

## 2010-11-02 DIAGNOSIS — R634 Abnormal weight loss: Secondary | ICD-10-CM

## 2010-11-02 LAB — CONVERTED CEMR LAB
Potassium: 4.1 meq/L (ref 3.5–5.3)
Sodium: 139 meq/L (ref 135–145)

## 2010-11-02 LAB — BASIC METABOLIC PANEL
BUN: 20 mg/dL (ref 6–23)
Calcium: 8.9 mg/dL (ref 8.4–10.5)
Creat: 1.17 mg/dL (ref 0.40–1.20)

## 2010-11-02 NOTE — Patient Instructions (Signed)
I hope you feel better and the tenderness/pain resolves. We need to get a CT scan to rule out something bad like Cancer.  We will let you know about the results after the test is performed.

## 2010-11-02 NOTE — Progress Notes (Signed)
  Subjective:    Patient ID: [name], female    DOB: [birth year], 77 y.o.   MRN: [id number]  Abdominal Pain This is a new problem. The current episode started in the past 7 days. The onset quality is gradual. The problem occurs constantly. The most recent episode lasted 5 days. The problem has been gradually worsening. The pain is located in the LLQ. The pain is at a severity of 5/10. The pain is moderate. The quality of the pain is aching, cramping and sharp. The abdominal pain radiates to the pelvis (left hip). Associated symptoms include flatus and weight loss. Pertinent negatives include no anorexia, arthralgias, constipation, diarrhea, dysuria, fever, frequency, hematochezia, hematuria, melena, nausea or vomiting.  Pt. Has 5 days of what she thought was hip pain left side. Turns out she has LLQ abdominal tenderness and a completely normal physical exam of the left hip. Perfect ROM, no bony tenderness, moved her leg into multiple positions without pain.    Review of Systems  Constitutional: Positive for weight loss, fatigue and unexpected weight change. Negative for fever, chills, diaphoresis and appetite change.  Cardiovascular: Negative for chest pain.  Gastrointestinal: Positive for abdominal pain and flatus. Negative for nausea, vomiting, diarrhea, constipation, melena, hematochezia, abdominal distention and anorexia.  Genitourinary: Negative for dysuria, frequency, hematuria, flank pain, vaginal bleeding, difficulty urinating and vaginal pain.  Musculoskeletal: Positive for back pain. Negative for arthralgias and gait problem.       Objective:   Physical Exam  Constitutional: She appears cachectic. She is cooperative.  Non-toxic appearance.  Abdominal: Soft. Normal appearance. She exhibits no distension, no ascites and no mass. There is tenderness in the left lower quadrant. There is guarding. There is no rigidity, no rebound, no CVA tenderness, no tenderness at McBurney's point  and negative Murphy's sign.         Tender   Musculoskeletal:       Right hip: Normal. She exhibits normal range of motion, normal strength, no tenderness, no bony tenderness, no swelling, no crepitus, no deformity and no laceration.  Neurological: She is alert.       Assessment & Plan:  Pt. Is a 77 y/o c/f here with concerns about a week of left hip pain. She in fact has LLQ pain/tenderness and 11 lbs weight loss in last two monthes. 1. Abd. Pain/tenderness and weight loss - will get CT abdomen and Pelvis with contrast to rule out occult malignancy. 2. Her hip is fine - she denies need for pain medication. 3. She will follow up after her results come back.

## 2010-11-07 ENCOUNTER — Encounter (HOSPITAL_COMMUNITY): Payer: Self-pay

## 2010-11-07 ENCOUNTER — Ambulatory Visit (HOSPITAL_COMMUNITY)
Admission: RE | Admit: 2010-11-07 | Discharge: 2010-11-07 | Disposition: A | Discharge status: Home / Self Care | Payer: PRIVATE HEALTH INSURANCE | Source: Ambulatory Visit | Attending: Family Medicine | Admitting: Family Medicine

## 2010-11-07 DIAGNOSIS — R634 Abnormal weight loss: Secondary | ICD-10-CM | POA: Insufficient documentation

## 2010-11-07 DIAGNOSIS — R109 Unspecified abdominal pain: Secondary | ICD-10-CM | POA: Insufficient documentation

## 2010-11-07 DIAGNOSIS — I77811 Abdominal aortic ectasia: Secondary | ICD-10-CM | POA: Insufficient documentation

## 2010-11-07 DIAGNOSIS — K8689 Other specified diseases of pancreas: Secondary | ICD-10-CM | POA: Insufficient documentation

## 2010-11-07 HISTORY — DX: Essential (primary) hypertension: I10

## 2010-11-07 MED ORDER — IOHEXOL 300 MG/ML  SOLN: 80.0000 mL | Freq: Once | INTRAMUSCULAR | Status: AC | PRN

## 2010-11-11 ENCOUNTER — Encounter: Payer: Self-pay | Admitting: Family Medicine

## 2010-11-19 ENCOUNTER — Encounter: Payer: Self-pay | Admitting: Sports Medicine

## 2010-11-19 ENCOUNTER — Ambulatory Visit (INDEPENDENT_AMBULATORY_CARE_PROVIDER_SITE_OTHER): Payer: PRIVATE HEALTH INSURANCE | Admitting: Sports Medicine

## 2010-11-19 DIAGNOSIS — G250 Essential tremor: Secondary | ICD-10-CM

## 2010-11-19 DIAGNOSIS — R627 Adult failure to thrive: Secondary | ICD-10-CM

## 2010-11-19 DIAGNOSIS — I10 Essential (primary) hypertension: Secondary | ICD-10-CM

## 2010-11-19 DIAGNOSIS — G252 Other specified forms of tremor: Secondary | ICD-10-CM

## 2010-11-19 MED ORDER — PRIMIDONE 50 MG PO TABS: 50.0000 mg | ORAL_TABLET | Freq: Every day | ORAL | Status: DC

## 2010-11-19 MED ORDER — MIRTAZAPINE 15 MG PO TABS: 15.0000 mg | ORAL_TABLET | Freq: Every day | ORAL | Status: DC

## 2010-11-19 NOTE — Assessment & Plan Note (Signed)
Well controlled, no changes 

## 2010-11-19 NOTE — Progress Notes (Signed)
  Subjective:    Patient ID: Candida Vetter, female    DOB: [birth year], 77 y.o.   MRN: [id number]  HPI Abd pain:  Resolved, CT neg.  Pt notes pain at L quadratus lumborum insertion on iliac crest, again, this has resolved.  Wt loss:  Now at 11 lb wt loss in 1 year.  On remeron 7.5, one half tab.  Tremor:  On primidone 25.  Does not interfered with ADLs.   Review of Systems    See HPI Objective:   Physical Exam  Constitutional:       Thin, elderly caucasian female.  Cardiovascular: Normal rate, regular rhythm and normal heart sounds.  Exam reveals no gallop and no friction rub.   No murmur heard. Pulmonary/Chest: Effort normal and breath sounds normal. No respiratory distress. She has no wheezes. She has no rales. She exhibits no tenderness.  Skin: Skin is warm and dry.          Assessment & Plan:

## 2010-11-19 NOTE — Assessment & Plan Note (Signed)
Does not interfere with ADLs. Increasing primidone to 50mg  one whole tab daily at bedtime

## 2010-11-19 NOTE — Assessment & Plan Note (Signed)
Wt loss of 12 lbs in about a year. Increasing Mirtazapine to 15 mg qHS.

## 2010-11-19 NOTE — Patient Instructions (Signed)
Great to see you, Increase your primidone to 50mg  at bedtime, may double to 100mg  if tremor is no better in a week. Increase your remeron to 15mg  at bedtime, I have called in the new 15mg  tabs, this should help with weight gain. No bloodwork needed today!. Come back to see me in a 30 min slot for a full geriatric assessment.   Ihor Austin. Benjamin Stain, M.D.

## 2010-11-21 ENCOUNTER — Encounter: Payer: Self-pay | Admitting: Home Health Services

## 2010-11-29 ENCOUNTER — Ambulatory Visit (INDEPENDENT_AMBULATORY_CARE_PROVIDER_SITE_OTHER): Payer: PRIVATE HEALTH INSURANCE | Admitting: Family Medicine

## 2010-11-29 ENCOUNTER — Encounter: Payer: Self-pay | Admitting: Family Medicine

## 2010-11-29 DIAGNOSIS — M1990 Unspecified osteoarthritis, unspecified site: Secondary | ICD-10-CM | POA: Insufficient documentation

## 2010-11-29 DIAGNOSIS — R634 Abnormal weight loss: Secondary | ICD-10-CM | POA: Insufficient documentation

## 2010-11-29 DIAGNOSIS — M810 Age-related osteoporosis without current pathological fracture: Secondary | ICD-10-CM

## 2010-11-29 DIAGNOSIS — I10 Essential (primary) hypertension: Secondary | ICD-10-CM

## 2010-11-29 NOTE — Assessment & Plan Note (Signed)
Continue Calcium and Vitamin D.  She is likely protein malnourished so would benefit from protein supplement to help maintain bone mass.

## 2010-11-29 NOTE — Progress Notes (Signed)
  Subjective:    Patient ID: [name], female    DOB: 23-Nov-1931, 77 y.o.   MRN: [id number]  HPI  Pt here for a geriatric evaluation and for a handicap placard.  She has no other questions or concerns.  Spent 45 minutes in face to face time with patient.  Performed comprehensive Geriatric assessment.  See results below.  Geri Assessment: 1. Geriatric Function Screen: 12/21 (problems with hearing, vision) 2. Mini Mobility Test:  Able to complete all tests.  Gait is steady.  Balance is good 3. MMSE: 28/30 (with world backward),  25/30 (with 100-7) 4. Geriatric Depression Scale: 5 positive responses.  Denies feeling of depression. 5. ADLs:  Able to complete all of her ADLs without assistance 6. IADLs: Able to complete all of her IADLs without help except for some help with finances  Review of Systems Denies fevers, chills, chest pain, shortness of breath    Objective:   Physical Exam  Constitutional: No distress.       Thin, frail, elderly female  HENT:  Nose: Nose normal.  Mouth/Throat: No oropharyngeal exudate.  Eyes: Conjunctivae are normal. No scleral icterus.       Dysconjugate gaze.    Neck: Normal range of motion. Neck supple. No thyromegaly present.  Cardiovascular: Normal rate and regular rhythm.   No murmur heard. Pulmonary/Chest: Effort normal and breath sounds normal. No respiratory distress. She has no wheezes.  Abdominal: Soft. Bowel sounds are normal. She exhibits no distension.  Musculoskeletal: She exhibits no edema.       Crepitus with both knees   Skin: Skin is warm and dry.  Psychiatric: She has a normal mood and affect.          Assessment & Plan:

## 2010-11-29 NOTE — Assessment & Plan Note (Signed)
At goal. No changes.  

## 2010-11-29 NOTE — Assessment & Plan Note (Signed)
Pt with difficulty with walking long distances.  Provided handicap placard.

## 2010-11-29 NOTE — Patient Instructions (Signed)
It was nice to meet you today You did well on your tests Try and balance getting enough nutrition and staying active If you are not hungry, supplements like Boost or Ensure can help give you some more calories Please schedule an appointment with Luretha Murphy for your Boniva injection in a couple of weeks

## 2010-11-29 NOTE — Assessment & Plan Note (Signed)
She has started to gain a little bit of weight with the Remeron.  Advised to supplement with Boost or Ensure.

## 2010-12-11 ENCOUNTER — Ambulatory Visit (INDEPENDENT_AMBULATORY_CARE_PROVIDER_SITE_OTHER): Payer: PRIVATE HEALTH INSURANCE | Admitting: Family Medicine

## 2010-12-11 ENCOUNTER — Telehealth: Payer: Self-pay | Admitting: Sports Medicine

## 2010-12-11 ENCOUNTER — Encounter: Payer: Self-pay | Admitting: Family Medicine

## 2010-12-11 VITALS — BP 166/81 | HR 76 | Temp 99.1°F | Ht 63.0 in | Wt 99.2 lb

## 2010-12-11 DIAGNOSIS — B0239 Other herpes zoster eye disease: Secondary | ICD-10-CM

## 2010-12-11 DIAGNOSIS — B0230 Zoster ocular disease, unspecified: Secondary | ICD-10-CM

## 2010-12-11 MED ORDER — ACYCLOVIR 400 MG PO TABS: 800.0000 mg | ORAL_TABLET | Freq: Every day | ORAL | Status: DC

## 2010-12-11 MED ORDER — ACYCLOVIR 400 MG PO TABS: 800.0000 mg | ORAL_TABLET | Freq: Every day | ORAL | Status: AC

## 2010-12-11 NOTE — Telephone Encounter (Signed)
Spoke with dtr. C/o no energy. Will have coffee in am & lay back down. Pain in forehead into eye. Pain in neck into eye.  States she has had cataract surgery before & is very concerned about her mom. Says this has just developed since last OV. Stated if she thought her mom could sit & wait, she would take her to ED. There are appts this pm. To front to make an appt

## 2010-12-11 NOTE — Progress Notes (Signed)
  Subjective:    Patient ID: [name], female    DOB: 07/23/1932, 77 y.o.   MRN: [id number]  HPI Pt with lens transplant and cataract removal 11/2010. Now with 1 week hx/o R eye irritation, HA, and rash. Currently taking opthalmic prednisone, moxifloxacin, NSAID, and eye rinse. L eye function relatively stable prior to onset of sxs. Daughter noticed pt with vesicles over L eye 3-4 days prior tp presentation. Temp up to 100 deg at home per daughter. + L sided headache. No nuchal rigidity, trismus, purulent eye drainage. Has been compliant with opthalmic and oral medication regimen per daughter.  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 L eye at baseline vision despite lens transplant and peripheral irritation. Prior history of chicken pox as a child. No hx/o zoster. Has not had zoster vaccine. Has not seen eye doctor since surgery.  Pt with planned ophthalmology follow up in am.    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 Will treat with high dose acyclovir over the next 7 days. Pt with planned follow with ophtalmology in am. Will defer detailed eye exam in setting of planned opthalmological follow up. Discussed eye/ infectious red flags. Pt instructed to follow up with PCP in 1 week for follow up. Pt/daughter agreeable to plan.

## 2010-12-11 NOTE — Assessment & Plan Note (Signed)
Overall presentation consistent with zoster opthmalmicus. Will treat with high dose acyclovir over the next 7 days. Pt with planned follow with ophtalmology in am. Will defer detailed eye exam in setting of planned opthalmological follow up. Discussed eye/ infectious red flags. Pt instructed to follow up with PCP in 1 week for follow up. Pt/daughter agreeable to plan.

## 2010-12-11 NOTE — Telephone Encounter (Signed)
Asking to speak with RN, pt has blister like lesions on the top of her eyes, feeling very tired, not sure whats going on but daughter is afraid it might be shingles, asking to be worked in this morning?

## 2010-12-11 NOTE — Patient Instructions (Addendum)
It was good to meet you today You likely have shingles of the eye (zoster opthalmicus) I am placing Ariadna on a 7 day course of high dose acyclovir to treat this  Please follow up with you eye doctor in the morning If Eriko develops any significant headache, fever, or any other concerning symptoms, please give [place] a call. Follow up with Dr. Karie Schwalbe next week, God Bless,  Doree Albee MD

## 2010-12-12 ENCOUNTER — Telehealth: Payer: Self-pay | Admitting: Family Medicine

## 2010-12-12 ENCOUNTER — Telehealth: Payer: Self-pay | Admitting: *Deleted

## 2010-12-12 ENCOUNTER — Telehealth: Payer: Self-pay | Admitting: Sports Medicine

## 2010-12-12 NOTE — Telephone Encounter (Signed)
Attempted to call pt/daughter back about ophthalmology follow up. Had to leave VM. Instructed pt/duaghter to call back during PM clinic to follow up on any questions that they may have had.  Left clinic number for call back.

## 2010-12-12 NOTE — Telephone Encounter (Signed)
Called and spoke to daughter about mom. Mom saw ophthalmology today. Ophtho concerned about intra-ocular invasion and pt was started on ocular gancyclovir today . Ophtho wondering about use of prednisone. Feels that it may be necessary but deferred rx for pcp. Mom overall stable per daughter. No worsening in pain or eye function per daughter. Pain improved with tylenol. Has had some progression of vesicles to involve medial nose. No worsening erythema or pain per pt. Discussed with daughter that mom is getting current standard of care including oral/eye antivirals as well as eye steroids. Told daughter that steroids may be an option if vision loss occurs, pain significantly worsens, or if pt develops any other concerning sxs. Daughter said that she forgot to make follow up appt for next week with PCP. Encouraged her to make appt. Also encouraged daughter to come in sooner if there were any concerning sxs. Daughter agreeable to plan.

## 2010-12-12 NOTE — Telephone Encounter (Signed)
dtr wants md to call her on her phone, not her mom's. States her mom's was turned off & she just now noticed there had been a call to it

## 2010-12-12 NOTE — Telephone Encounter (Signed)
Saw ophthalmologist also and was told that she needs some type of steroid to prevent any neuralgia in the future.  Needs for the doctor to prescribe something for this - [facility] in [place]. Pt is also nausea/vominting and is very tearful and shaky - overwhelmed by everything - not sure what to do about this

## 2010-12-13 ENCOUNTER — Encounter: Payer: Self-pay | Admitting: Family Medicine

## 2010-12-13 ENCOUNTER — Inpatient Hospital Stay (HOSPITAL_COMMUNITY): Payer: PRIVATE HEALTH INSURANCE

## 2010-12-13 ENCOUNTER — Telehealth: Payer: Self-pay | Admitting: Sports Medicine

## 2010-12-13 ENCOUNTER — Ambulatory Visit (INDEPENDENT_AMBULATORY_CARE_PROVIDER_SITE_OTHER): Payer: PRIVATE HEALTH INSURANCE | Admitting: Family Medicine

## 2010-12-13 ENCOUNTER — Inpatient Hospital Stay (HOSPITAL_COMMUNITY)
Admission: AD | Admit: 2010-12-13 | Discharge: 2010-12-17 | DRG: 125 | Disposition: A | Discharge status: Home Health | Payer: PRIVATE HEALTH INSURANCE | Source: Ambulatory Visit | Attending: Family Medicine | Admitting: Family Medicine

## 2010-12-13 DIAGNOSIS — R5381 Other malaise: Secondary | ICD-10-CM | POA: Diagnosis present

## 2010-12-13 DIAGNOSIS — M810 Age-related osteoporosis without current pathological fracture: Secondary | ICD-10-CM | POA: Diagnosis present

## 2010-12-13 DIAGNOSIS — R0789 Other chest pain: Secondary | ICD-10-CM | POA: Diagnosis present

## 2010-12-13 DIAGNOSIS — E876 Hypokalemia: Secondary | ICD-10-CM | POA: Diagnosis present

## 2010-12-13 DIAGNOSIS — I2510 Atherosclerotic heart disease of native coronary artery without angina pectoris: Secondary | ICD-10-CM | POA: Diagnosis present

## 2010-12-13 DIAGNOSIS — I10 Essential (primary) hypertension: Secondary | ICD-10-CM | POA: Diagnosis present

## 2010-12-13 DIAGNOSIS — B0239 Other herpes zoster eye disease: Secondary | ICD-10-CM

## 2010-12-13 DIAGNOSIS — Z79899 Other long term (current) drug therapy: Secondary | ICD-10-CM

## 2010-12-13 DIAGNOSIS — M1990 Unspecified osteoarthritis, unspecified site: Secondary | ICD-10-CM | POA: Diagnosis present

## 2010-12-13 DIAGNOSIS — Z7982 Long term (current) use of aspirin: Secondary | ICD-10-CM

## 2010-12-13 DIAGNOSIS — G250 Essential tremor: Secondary | ICD-10-CM | POA: Diagnosis present

## 2010-12-13 DIAGNOSIS — R627 Adult failure to thrive: Secondary | ICD-10-CM | POA: Diagnosis present

## 2010-12-13 DIAGNOSIS — IMO0002 Reserved for concepts with insufficient information to code with codable children: Secondary | ICD-10-CM

## 2010-12-13 DIAGNOSIS — E785 Hyperlipidemia, unspecified: Secondary | ICD-10-CM | POA: Diagnosis present

## 2010-12-13 DIAGNOSIS — B0230 Zoster ocular disease, unspecified: Secondary | ICD-10-CM

## 2010-12-13 LAB — COMPREHENSIVE METABOLIC PANEL
ALT: 30 U/L (ref 0–35)
Albumin: 3.6 g/dL (ref 3.5–5.2)
Alkaline Phosphatase: 186 U/L — ABNORMAL HIGH (ref 39–117)
BUN: 16 mg/dL (ref 6–23)
Chloride: 102 mEq/L (ref 96–112)
Glucose, Bld: 117 mg/dL — ABNORMAL HIGH (ref 70–99)
Potassium: 4.3 mEq/L (ref 3.5–5.1)
Total Bilirubin: 0.3 mg/dL (ref 0.3–1.2)

## 2010-12-13 LAB — CBC
HCT: 34.2 % — ABNORMAL LOW (ref 36.0–46.0)
MCV: 87.7 fL (ref 78.0–100.0)
RBC: 3.9 MIL/uL (ref 3.87–5.11)
WBC: 11.1 10*3/uL — ABNORMAL HIGH (ref 4.0–10.5)

## 2010-12-13 MED ORDER — PROMETHAZINE HCL 25 MG/ML IJ SOLN
12.5000 mg | Freq: Once | INTRAMUSCULAR | Status: AC
  Administered 2010-12-13: 12.5 mg via INTRAMUSCULAR

## 2010-12-13 NOTE — Progress Notes (Signed)
Addended by: Loralee Pacas on: 12/13/2010 03:47 PM   Modules accepted: Orders

## 2010-12-13 NOTE — Telephone Encounter (Signed)
Patient diagnosed with Zoster Opthalmicus yesterday by Dr. Alvester Morin.  Daughter calling today saying that her mom's pain is worse and she feels that she has declined, having a hard time getting her to eat and drink.  Also says that the blisters are now spreading down her nose and side of her face.  Gave her a [place] appt with Dr. Edmonia James for 1:45.

## 2010-12-13 NOTE — Telephone Encounter (Signed)
Requesting to speak with RN re: pts new diagnosis. Having a hard time taking care of pt, feels like pt is getting worse & thinks pt needs to be admitted.

## 2010-12-13 NOTE — Progress Notes (Signed)
  Subjective:    Patient ID: [name], female    DOB: [birth year], 77 y.o.   MRN: [id number]  HPI History obtained from daughter. 71 yo seen last week for zoster.  Started on acyclovir and followed up the next day with ophtho, who also added gancyclovir and to continue the prednisone drops.  Now returns to clinic with persistent, intolerable pain, nausea and vomiting, weight loss.  Pt is taking acetaminophen for pain, but she vomits it up.  Also vomits acyclovir. Unable to tolerate any po intake.  Rash continues, and seems to be spreading.    Review of Systems Denies urinary c/o, breathing problems.  + fatigue, anorexia, vomiting, fevers chills.  Loose stools today, otherwise nl BMs.  Very decreased activity.     Objective:   Physical Exam  Constitutional:       Cachetic, sitting in wheelchair resting head on pillow.  Moans in pain.  Tearful  HENT:       Normocephalic.  + blistering vesicles around L eye extending to hairline and scalp.  Dentures in place.  OP clear with slightly dry MM.  Eyes: Conjunctivae are normal. Right eye exhibits no discharge. Left eye exhibits no discharge. No scleral icterus.  Cardiovascular: Normal rate and regular rhythm.  Exam reveals no gallop and no friction rub.   No murmur heard. Abdominal: She exhibits no mass. There is no guarding.       Abdominal palpation worsened nausea          Assessment & Plan:  Zoster ophthalmicus - pt now with inablity to tolerate po intake, unable to keep meds down and in intractable Pain.  Will admit to teaching service for IV acyclovir, inpatient ophtho consult, Hydration, and pain management.  Pt and family agree with plan.

## 2010-12-14 LAB — URINALYSIS, ROUTINE W REFLEX MICROSCOPIC
Glucose, UA: NEGATIVE mg/dL
Leukocytes, UA: NEGATIVE
Nitrite: NEGATIVE
Protein, ur: 30 mg/dL — AB

## 2010-12-14 LAB — URINE MICROSCOPIC-ADD ON

## 2010-12-14 LAB — CARDIAC PANEL(CRET KIN+CKTOT+MB+TROPI)
CK, MB: 0.6 ng/mL (ref 0.3–4.0)
Relative Index: INVALID (ref 0.0–2.5)
Relative Index: INVALID (ref 0.0–2.5)
Troponin I: 0.01 ng/mL (ref 0.00–0.06)

## 2010-12-16 LAB — BASIC METABOLIC PANEL
BUN: 28 mg/dL — ABNORMAL HIGH (ref 6–23)
CO2: 23 mEq/L (ref 19–32)
Calcium: 8.9 mg/dL (ref 8.4–10.5)
Creatinine, Ser: 1.37 mg/dL — ABNORMAL HIGH (ref 0.4–1.2)
GFR calc non Af Amer: 37 mL/min — ABNORMAL LOW (ref >60)
Glucose, Bld: 94 mg/dL (ref 70–99)

## 2010-12-16 LAB — CBC
HCT: 28.9 % — ABNORMAL LOW (ref 36.0–46.0)
Hemoglobin: 9.4 g/dL — ABNORMAL LOW (ref 12.0–15.0)
MCH: 28.2 pg (ref 26.0–34.0)
MCHC: 32.5 g/dL (ref 30.0–36.0)
MCV: 86.8 fL (ref 78.0–100.0)
RDW: 15.3 % (ref 11.5–15.5)

## 2010-12-17 ENCOUNTER — Ambulatory Visit: Payer: PRIVATE HEALTH INSURANCE | Admitting: Family Medicine

## 2010-12-24 ENCOUNTER — Ambulatory Visit (INDEPENDENT_AMBULATORY_CARE_PROVIDER_SITE_OTHER): Payer: PRIVATE HEALTH INSURANCE | Admitting: Family Medicine

## 2010-12-24 VITALS — BP 99/64 | HR 78 | Temp 98.6°F | Ht 63.0 in | Wt 96.0 lb

## 2010-12-24 DIAGNOSIS — I10 Essential (primary) hypertension: Secondary | ICD-10-CM

## 2010-12-24 DIAGNOSIS — M810 Age-related osteoporosis without current pathological fracture: Secondary | ICD-10-CM

## 2010-12-24 LAB — CBC
MCV: 91 fL (ref 78.0–100.0)
Platelets: 345 10*3/uL (ref 150–400)
RBC: 3.66 MIL/uL — ABNORMAL LOW (ref 3.87–5.11)
RDW: 17.9 % — ABNORMAL HIGH (ref 11.5–15.5)
WBC: 16.7 10*3/uL — ABNORMAL HIGH (ref 4.0–10.5)

## 2010-12-24 LAB — BASIC METABOLIC PANEL
BUN: 27 mg/dL — ABNORMAL HIGH (ref 6–23)
Creat: 1.37 mg/dL — ABNORMAL HIGH (ref 0.40–1.20)
Glucose, Bld: 105 mg/dL — ABNORMAL HIGH (ref 70–99)

## 2010-12-24 NOTE — Progress Notes (Signed)
  Subjective:    Patient ID: [name], female    DOB: 1931-10-09, 77 y.o.   MRN: [id number]  HPI IVP Boniva today for treatment of osteoporosis.  Currently on acyclovir for ophth zoster, not feeling well.   Review of Systems     Objective:   Physical Exam    Frail today    Assessment & Plan:

## 2010-12-24 NOTE — Assessment & Plan Note (Signed)
IVP Boniva, excellent blood return, tolerated procedure well

## 2010-12-25 ENCOUNTER — Telehealth: Payer: Self-pay | Admitting: *Deleted

## 2010-12-25 ENCOUNTER — Emergency Department (HOSPITAL_COMMUNITY): Payer: PRIVATE HEALTH INSURANCE

## 2010-12-25 ENCOUNTER — Inpatient Hospital Stay (HOSPITAL_COMMUNITY)
Admission: EM | Admit: 2010-12-25 | Discharge: 2010-12-28 | DRG: 641 | Disposition: A | Discharge status: Skilled Nursing Facility | Payer: PRIVATE HEALTH INSURANCE | Attending: Family Medicine | Admitting: Family Medicine

## 2010-12-25 DIAGNOSIS — F3289 Other specified depressive episodes: Secondary | ICD-10-CM | POA: Diagnosis present

## 2010-12-25 DIAGNOSIS — Z66 Do not resuscitate: Secondary | ICD-10-CM | POA: Diagnosis present

## 2010-12-25 DIAGNOSIS — I2510 Atherosclerotic heart disease of native coronary artery without angina pectoris: Secondary | ICD-10-CM | POA: Diagnosis present

## 2010-12-25 DIAGNOSIS — E876 Hypokalemia: Secondary | ICD-10-CM | POA: Diagnosis present

## 2010-12-25 DIAGNOSIS — E785 Hyperlipidemia, unspecified: Secondary | ICD-10-CM | POA: Diagnosis present

## 2010-12-25 DIAGNOSIS — R627 Adult failure to thrive: Secondary | ICD-10-CM | POA: Diagnosis present

## 2010-12-25 DIAGNOSIS — B0239 Other herpes zoster eye disease: Secondary | ICD-10-CM | POA: Diagnosis present

## 2010-12-25 DIAGNOSIS — E860 Dehydration: Principal | ICD-10-CM | POA: Diagnosis present

## 2010-12-25 DIAGNOSIS — F17200 Nicotine dependence, unspecified, uncomplicated: Secondary | ICD-10-CM | POA: Diagnosis present

## 2010-12-25 DIAGNOSIS — R509 Fever, unspecified: Secondary | ICD-10-CM

## 2010-12-25 DIAGNOSIS — M810 Age-related osteoporosis without current pathological fracture: Secondary | ICD-10-CM | POA: Diagnosis present

## 2010-12-25 DIAGNOSIS — R5381 Other malaise: Secondary | ICD-10-CM

## 2010-12-25 DIAGNOSIS — F329 Major depressive disorder, single episode, unspecified: Secondary | ICD-10-CM | POA: Diagnosis present

## 2010-12-25 DIAGNOSIS — I10 Essential (primary) hypertension: Secondary | ICD-10-CM | POA: Diagnosis present

## 2010-12-25 DIAGNOSIS — D509 Iron deficiency anemia, unspecified: Secondary | ICD-10-CM | POA: Diagnosis present

## 2010-12-25 DIAGNOSIS — R5383 Other fatigue: Secondary | ICD-10-CM

## 2010-12-25 HISTORY — DX: Atherosclerotic heart disease of native coronary artery without angina pectoris: I25.10

## 2010-12-25 HISTORY — DX: Tremor, unspecified: R25.1

## 2010-12-25 HISTORY — DX: Zoster without complications: B02.9

## 2010-12-25 LAB — COMPREHENSIVE METABOLIC PANEL
ALT: 50 U/L — ABNORMAL HIGH (ref 0–35)
AST: 34 U/L (ref 0–37)
CO2: 25 mEq/L (ref 19–32)
Calcium: 9 mg/dL (ref 8.4–10.5)
Creatinine, Ser: 1.17 mg/dL (ref 0.4–1.2)
GFR calc Af Amer: 54 mL/min — ABNORMAL LOW (ref >60)
GFR calc non Af Amer: 45 mL/min — ABNORMAL LOW (ref >60)
Sodium: 138 mEq/L (ref 135–145)
Total Protein: 7.2 g/dL (ref 6.0–8.3)

## 2010-12-25 LAB — URINALYSIS, ROUTINE W REFLEX MICROSCOPIC
Nitrite: NEGATIVE
Protein, ur: NEGATIVE mg/dL
Specific Gravity, Urine: 1.006 (ref 1.005–1.030)
Urobilinogen, UA: 0.2 mg/dL (ref 0.0–1.0)

## 2010-12-25 LAB — DIFFERENTIAL
Basophils Absolute: 0 10*3/uL (ref 0.0–0.1)
Eosinophils Absolute: 0.1 10*3/uL (ref 0.0–0.7)
Eosinophils Relative: 0 % (ref 0–5)
Lymphocytes Relative: 11 % — ABNORMAL LOW (ref 12–46)
Lymphs Abs: 1.4 10*3/uL (ref 0.7–4.0)
Neutrophils Relative %: 81 % — ABNORMAL HIGH (ref 43–77)

## 2010-12-25 LAB — CBC
HCT: 29.2 % — ABNORMAL LOW (ref 36.0–46.0)
MCHC: 33.2 g/dL (ref 30.0–36.0)
Platelets: 253 10*3/uL (ref 150–400)
RDW: 17.1 % — ABNORMAL HIGH (ref 11.5–15.5)
WBC: 13.1 10*3/uL — ABNORMAL HIGH (ref 4.0–10.5)

## 2010-12-25 NOTE — Telephone Encounter (Signed)
Called ms. Roskos's daughter to inform her of the change in her meds. She told me that pt was admitted to the hospital this morning. I told her that should she need anything to be sure to call us.Loralee Pacas [place]

## 2010-12-25 NOTE — Telephone Encounter (Signed)
Called pt and lvm for her to return call.[name]  

## 2010-12-25 NOTE — Telephone Encounter (Signed)
Lvm to inform pt to d/c lisinopril and to drink gatoraid due to being dehydrated. i will call pt back to ensure receipt of message.Loralee Pacas [place]

## 2010-12-26 LAB — CBC
HCT: 26 % — ABNORMAL LOW (ref 36.0–46.0)
Hemoglobin: 8.3 g/dL — ABNORMAL LOW (ref 12.0–15.0)
MCHC: 31.9 g/dL (ref 30.0–36.0)
MCV: 88.7 fL (ref 78.0–100.0)
RDW: 17.3 % — ABNORMAL HIGH (ref 11.5–15.5)

## 2010-12-26 LAB — BASIC METABOLIC PANEL
BUN: 20 mg/dL (ref 6–23)
CO2: 20 mEq/L (ref 19–32)
Calcium: 7.8 mg/dL — ABNORMAL LOW (ref 8.4–10.5)
Glucose, Bld: 107 mg/dL — ABNORMAL HIGH (ref 70–99)
Potassium: 3.4 mEq/L — ABNORMAL LOW (ref 3.5–5.1)
Sodium: 135 mEq/L (ref 135–145)

## 2010-12-26 LAB — RETICULOCYTES: Retic Ct Pct: 0.9 % (ref 0.4–3.1)

## 2010-12-26 LAB — TSH: TSH: 0.647 u[IU]/mL (ref 0.350–4.500)

## 2010-12-26 LAB — URINE CULTURE
Colony Count: NO GROWTH
Culture  Setup Time: 201205080919

## 2010-12-27 ENCOUNTER — Inpatient Hospital Stay (HOSPITAL_COMMUNITY): Payer: PRIVATE HEALTH INSURANCE

## 2010-12-27 LAB — FERRITIN: Ferritin: 235 ng/mL (ref 10–291)

## 2010-12-27 LAB — IRON AND TIBC: UIBC: 214 ug/dL

## 2010-12-27 LAB — BASIC METABOLIC PANEL
BUN: 17 mg/dL (ref 6–23)
Calcium: 8.5 mg/dL (ref 8.4–10.5)
Creatinine, Ser: 0.85 mg/dL (ref 0.4–1.2)
GFR calc non Af Amer: >60 mL/min (ref >60)
Glucose, Bld: 101 mg/dL — ABNORMAL HIGH (ref 70–99)
Potassium: 4.2 mEq/L (ref 3.5–5.1)

## 2010-12-27 LAB — CBC
HCT: 25.9 % — ABNORMAL LOW (ref 36.0–46.0)
MCH: 28.4 pg (ref 26.0–34.0)
MCHC: 32.4 g/dL (ref 30.0–36.0)
MCV: 87.5 fL (ref 78.0–100.0)
RDW: 17.1 % — ABNORMAL HIGH (ref 11.5–15.5)

## 2010-12-28 ENCOUNTER — Inpatient Hospital Stay (HOSPITAL_COMMUNITY): Payer: PRIVATE HEALTH INSURANCE

## 2010-12-28 ENCOUNTER — Inpatient Hospital Stay: Payer: PRIVATE HEALTH INSURANCE | Admitting: Sports Medicine

## 2010-12-28 ENCOUNTER — Encounter (HOSPITAL_COMMUNITY): Payer: Self-pay | Admitting: Radiology

## 2010-12-29 ENCOUNTER — Telehealth: Payer: Self-pay | Admitting: Family Medicine

## 2010-12-29 NOTE — Telephone Encounter (Signed)
Received call from pt's mother about being dissatified with the amenities at [facility]. Pt recently d/c'd from hospital (sepsis r/o) to [facility].Daughter upset because pt does not hav TV in her room and does not have a private shower. Wants something to be done today about issue.  Pt overall stable per daughter. No significant medical issues per daughter. Apologized to daughter and told her that she may likely have to wait until Monday to have issues addressed. Told daughter to call back if there were any medical issues. Daughter agreeable.

## 2010-12-31 LAB — CULTURE, BLOOD (ROUTINE X 2): Culture  Setup Time: 201205081626

## 2010-12-31 LAB — PROTEIN ELECTROPHORESIS, SERUM
Alpha-2-Globulin: 21.5 % — ABNORMAL HIGH (ref 7.1–11.8)
M-Spike, %: NOT DETECTED g/dL
Total Protein ELP: 6.2 g/dL (ref 6.0–8.3)

## 2011-01-02 LAB — CULTURE, BLOOD (ROUTINE X 2)
Culture  Setup Time: 201205100501
Culture: NO GROWTH

## 2011-01-16 ENCOUNTER — Encounter: Payer: Self-pay | Admitting: Family Medicine

## 2011-01-16 ENCOUNTER — Ambulatory Visit (INDEPENDENT_AMBULATORY_CARE_PROVIDER_SITE_OTHER): Payer: PRIVATE HEALTH INSURANCE | Admitting: Family Medicine

## 2011-01-16 DIAGNOSIS — B0230 Zoster ocular disease, unspecified: Secondary | ICD-10-CM

## 2011-01-16 DIAGNOSIS — B0239 Other herpes zoster eye disease: Secondary | ICD-10-CM

## 2011-01-16 MED ORDER — PREGABALIN 75 MG PO CAPS: 75.0000 mg | ORAL_CAPSULE | Freq: Two times a day (BID) | ORAL | Status: DC

## 2011-01-16 NOTE — Assessment & Plan Note (Signed)
Will increase lyrica from 75 qd to 75 bid.  Advised to be careful with dizziness, given gait instability.

## 2011-01-16 NOTE — Progress Notes (Signed)
  Subjective:    Patient ID: [name], female    DOB: 09/27/1931, 77 y.o.   MRN: [id number]  HPI Patient here for continued eye pain  Mid April 2012 was dx with herpes zoster ophthalmicus.  Was tx with topical ganciclovir.  Is finishing up several weeks in nursing facility for deconditioning.  Notes no change in vision, fever, rash, or headache.  Overall pain is decreasing.  Saw ophtho 1-2 weeks ago who felt like vision was good and prescribed lubricating eye drops.  No need for follow-up per pt.  Has been taking lyrica for several weeks.  Review of Systemssee HPI     Objective:   Physical Exam  Eyes: Conjunctivae, EOM and lids are normal. Pupils are equal, round, and reactive to light. Right eye exhibits no discharge and no exudate. No foreign body present in the right eye. Left eye exhibits no discharge and no exudate. No foreign body present in the left eye. No scleral icterus.  Skin over forehead decreased sensation.  Pain over eyebrow        Assessment & Plan:

## 2011-01-16 NOTE — Patient Instructions (Addendum)
Start taking lyrica 75 mg twice a day Continue tio use moisturizing eye drops.   Follow-up with PCP

## 2011-01-17 NOTE — H&P (Signed)
[name], [name] NO.:  [id number]  MEDICAL RECORD NO.:  [id number]           PATIENT TYPE:  E  LOCATION:  [facility]                         FACILITY:  [facility]  PHYSICIAN:  Pearlean Brownie, M.D.DATE OF BIRTH:  1932-01-16  DATE OF ADMISSION:  12/25/2010 DATE OF DISCHARGE:                             HISTORY & PHYSICAL   PRIMARY CARE PHYSICIAN:  Barbara Becton, MD at the [facility].  CHIEF COMPLAINT:  Weakness and fever.  HISTORY OF PRESENT ILLNESS:  [name] is a 77 year old female with a history of failure to thrive, osteoporosis, and recent zoster ophthalmicus, who presented to [facility] with a reported fever of 102.  The patient is living with her daughter at this time.  The daughter states that she was awoken by hearing her mother knocking on the wall.  She went to see her mother and saw that she looked warm, sotook her temperature and it was over 102.  The daughter gave the patient 2 Tylenol and her reported temperature was down to 100 in 30 minutes. The patient continued to have weakness to the point that she was not being able to get out of bed, so the daughter called 911.  Workup in the ER for fever source found no source.  Chest x-ray showed no acute disease.  Urine was completely negative.  The patient does have a white count that has decreased since yesterday from 16.7 to 13.1 and she does have a left shift.  Vitals are normal and stable.  The patient's history was reviewed.  She was hospitalized in April 2012, for zoster ophthalmicus.  At that time, she was noted to have the diagnosis of failure to thrive and deconditioning.  Physical Therapy saw her at that time and recommended either skilled nursing facility or home with 24- hour supervision.  At that time, the patient and her daughter did opt for home with 24-hour supervision.  The patient and her daughter note that she has continued to have weight  loss and increased weakness since that discharge.  At this time, they both agree to SNF placement.  Also reviewing records, the patient did complain of abdominal pain about a few weeks ago.  At that time, a CT of her abdomen was complete and it was negative.  PAST MEDICAL HISTORY: 1. Failure to thrive. 2. Tobacco user.  The patient endorses 6-7 cigarettes per day. 3. Essential tremor. 4. Hypertension. 5. Hyperlipidemia. 6. Coronary artery disease. 7. Chronic allergic rhinitis. 8. Osteoporosis. 9. Osteoarthritis. 10.Zoster ophthalmicus. 11.Depression. 12.Chronic pancreatitis.  SOCIAL HISTORY:  The patient usually lives alone, but at this time, she is living in [place] with her daughter.  She does have a worker that comes to help her with ADLs.  She has not been consistent with Home Health PT, though.  Tobacco 6-7 cigarettes per day.  No alcohol, no drug use.  FAMILY HISTORY:  Noncontributory.  Medications include: 1. Aspirin 81 mg p.o. daily. 2. Tums 500 mg 4 tablets p.o. daily. 3. Vitamin D 1000 International Units p.o. daily. 4. Flonase 50 mcg for ACT 1 spray per nostril daily.  5. Ganciclovir 0.5% gel to eye daily. 6. Boniva 3 mg infusion every 3 months and her last dose was     yesterday. 7. Lisinopril 10 mg p.o. daily. 8. Toprol-XL 100 mg p.o. daily. 9. Remeron 50 mg at bedtime. 10.Zocor 20 mg p.o. daily.  REVIEW OF SYSTEMS:  Positive for fever, chills, fatigue, weight change, myalgias, arthralgias, and weakness.  It is negative for sweats, appetite changes, headache, sore throat, ear pain, rhinorrhea, chest pain, edema, palpitations, cough, dyspnea, wheeze, sputum production, nausea, vomiting, diarrhea, constipation, abdominal pain, dysuria, back pain, numbness, dizziness, polyuria, polydipsia.  PHYSICAL EXAMINATION:  VITAL SIGNS:  Temperature 98.7, pulse 72, respiratory rate 17, blood pressure 112/52, pulse ox 98% on room air. GENERAL:  She is alert and seems  to be oriented.  She is not distressed at this time. HEENT:  Normocephalic, atraumatic.  Pupils are equally round and reactive to light.  Extraocular muscles are intact.  She is cachectic. She does endorse some hyperesthesia to her left eye. NECK:  Supple with no thyromegaly.  No JVD. HEART:  Regular rate and rhythm with a 2/3 systolic murmur. LUNGS:  Clear to auscultation bilaterally. ABDOMEN:  Soft, positive bowel sounds x4.  She is nondistended, nontender. EXTREMITIES:  No clubbing, cyanosis, or edema. NEUROLOGIC:  Normal sensation.  Normal reflexes.  Cranial nerves II through XII are intact. MUSCULOSKELETAL:  She does move extremities symmetrically both upper and lower, seems to have 3-4/5 strength all around.  LABORATORY DATA AND STUDIES:  Chest x-ray, no acute disease.  Urine negative.  Sodium 138, potassium 3.7, chloride 104, CO2 of 25, BUN 27; creatinine 1.17, this was down to 1.37 yesterday; glucose 111.  White count 13.1, hemoglobin 9.7, hematocrit 29.2, platelets 253, ANC is 10.6. Alk phos is 165, AST is 34, ALT is 50, albumin is 2.9.  ASSESSMENT AND PLAN:  This lady is a nice 77 year old female with: 1. Fever.  This was self-reported up until 5 minutes ago.  The patient     endorses fever of 102 at home, dosed with Tylenol.  Five hours     after her first fever, she was found to have another of 103 in the     emergency department.  No fever source has been identified at this     time.  She does have a white count and a left shift.  Note that her     white count did improve since yesterday.  We will obtain blood     cultures and wait for urine cultures as well.  We will continue to     do serial exams and monitor her for signs and symptoms as well as     vitals very closely.  We will hold on treating with empiric     antibiotics at this time. 2. Failure to thrive.  This is chronic and is worked up as an     outpatient.  We will continue the patient's supplements. 3.  Deconditioning.  At this point, the patient does need skilled     nursing facility placement, though the patient and daughter are     amenable to this.  Social worker, Physical Therapy/Occupational     Therapy have been consulted. 4. Dehydration.  This was seen on exam, especially yesterday with     improvement today on recent labs as the patient did have acute     renal failure yesterday, likely hemoconcentration on her CBC.  We     will continue the patient's  hydration. 5. Hyperlipidemia, hypertension, and osteoporosis.  We will continue     the patient's home medications. 6. Tobacco.  Smoking cessation consult. 7. Fluids, electrolytes, nutrition/gastrointestinal.  Maintenance IV     fluids and push fluids in this patient. 8. Prophylaxis.  Heparin subcu t.i.d. 9. Disposition.  Pending placement and evaluation of fever.     Helane Rima, MD   ______________________________ Pearlean Brownie, M.D.    EW/MEDQ  D:  12/25/2010  T:  12/25/2010  Job:  [id number]  Electronically Signed by Helane Rima MD on 01/02/2011 03:40:29 PM Electronically Signed by Pearlean Brownie M.D. on 01/17/2011 09:36:13 AM

## 2011-01-17 NOTE — Discharge Summary (Signed)
NAMEBRIGETTA, [name]             ACCOUNT NO.:  [id number]  MEDICAL RECORD NO.:  [id number]           PATIENT TYPE:  I  LOCATION:  3709                         FACILITY:  [facility]  PHYSICIAN:  Pearlean Brownie, M.D.DATE OF BIRTH:  [birth year]  DATE OF ADMISSION:  12/13/2010 DATE OF DISCHARGE:  12/17/2010                              DISCHARGE SUMMARY   PRIMARY CARE PROVIDER:  Ihor Austin. Benjamin Stain, MD, at [facility].  DISCHARGE DIAGNOSES: 1. Left herpes ophthalmicus. 2. Hypertension. 3. Coronary artery disease. 4. Osteoarthritis. 5. Severe osteoporosis. 6. Hyperlipidemia. 7. Failure to thrive neck.  DISCHARGE MEDICATIONS: 1. Acyclovir 800 mg p.o. t.i.d. x4 days for a total of 7-day course. 2. Vicodin 1 tablet p.o. q.6 h. P.r.n. 3. Boost 1 can p.o. t.i.d. before meals. 4. Ganciclovir eye gel apply 5 times a day for 2 weeks per     Ophthalmology. 5. Prednisolone 1% ophthalmic solution 1 drop left eye b.i.d. 6. Prednisone taper x5 days. 7. Zofran 4 mg p.o. q.6 h. p.r.n. 8. Aspirin 81 mg p.o. every morning. 9. Boniva injections 3 times per month. 10.Calcium carbonate 2 g p.o. q.a.m. 11.Flonase 1 spray b.i.d. 12.Lisinopril 10 mg p.o. q.a.m. 13.Mirtazapine 50 mg p.o. daily. 14.Primidone 25 mg p.o. nightly. 15.Simvastatin 40 mg p.o. q.a.m. 16.Toprol 100 mg p.o. q.a.m. 17.Tylenol 1 g p.o. q.4 h. 18.Vitamin D 1000 units p.o. q.a.m.  CONSULTS:  None.  PROCEDURES:  Chest x-ray on December 13, 2010, with no acute findings.  LABORATORY DATA:  On admission; the patient's CBC showed a white count of 11.1, hemoglobin 11.0, platelets 300.  CMET was within normal limits with the exception of a mildly elevated alk phos at 186 and AST of 40, ALT was normal at 30.  Cardiac enzymes were cycled and negative x2 with troponin of 0.01 and 0.01.  Vitamin B12 was normal at 428.  Urinalysis on admission was negative except for 30 protein, few epithelial cells, few bacteria.   Prior to the day of discharge, the patient's white count had decreased to 9.8, hemoglobin 9.4, platelets 261.  BMET on the day prior to discharge was within normal limits with the exception of potassium of 3.2 and a creatinine which had increased from 0.94 to 1.37.  BRIEF HOSPITAL SUMMARY:  This is a 77 year old female presenting with left herpes ophthalmicus as well as nausea, vomiting and an episode of chest pain. 1. Left herpes ophthalmicus.  The patient was started on high-dose IV     acyclovir initially 5 times per day, but was decreased to 3 times     per day prior to discharge due to worsening creatinine clearance     and kidney function.  The patient had been previously seen by     Ophthalmology and so no in-house Ophthalmology consult was formally     obtained, but we did speak with patient's ophthalmologist who     recommended continuing the ganciclovir eye gel 5 times a day for 2     weeks.  In addition, the patient was given prednisolone eye drops     to help with any inflammation and scarring.  Initially IV Solu-  Medrol was started, however, this was quickly changed to p.o.     prednisone to also assist with the inflammation due to this herpes     zoster flare.  The patient initially had severe pain around her     left eye where the infection was.  However this quickly improved     after 1 day of prednisone and acyclovir.  The patient was not     requiring any IV morphine and had clear vision out of her left eye     on the day of discharge.  The patient is to follow up with her     ophthalmologist.  Per their recommendations, the patient will call     Ophthalmology to set up this appointment.  The patient will     continue acyclovir for a total of 7-day course and prednisone for a     10-day taper. 2. Chest pain.  Per patient, she states that the chest pain was more     so with movement in the car, however, she was placed on telemetry     overnight.  On her initial day  in the hospital, there were no acute     event inflammatory and the patient's cardiac enzymes were cycled     and were negative x2. 3. Nausea and vomiting.  On admission, the patient initially had     severe nausea and vomiting, was not able to take anything p.o.     however.  Once her left herpes ophthalmicus was being treated, the     patient's appetite greatly improved.  She was able to start taking     her boost shakes 3 times a day prior to meals, so she does at home     and was able to tolerate all of her medications p.o. 4. Failure to thrive.  No testing was done in-house as the patient's     primary care provider is working this up as an outpatient.     However, the patient's home boost shakes were continued.  Albumin     was a low normal at 3.6 on admission. 5. Hypertension.  The patient's blood pressures ranged mostly in the     150s-160s which is an acceptable range for an elderly female.  The     patient was continued on her home lisinopril and Toprol.  No acute     changes were made to her home blood pressure medications. 6. Generalized weakness/deconditioning.  The patient was evaluated by     home health physical therapy who determined that a nursing home or     SNF may be appropriate for this patient versus home health physical     therapy.  After lengthy discussion with the patient's daughter, it     was decided that the patient would do much better at home with home     health physical therapy and 24-hour supervision.  Home health     physical therapy and occupational therapy were set up prior to the     patient's discharge.  In addition a home health RN will perform a     home safety evaluation for the patient in order to assure that she     is in a safe environment while at home.  DISCHARGE INSTRUCTIONS:  The patient was instructed to increase activity slowly walking with assistance.  No restrictions on her diet but was encouraged to increase her caloric  intake.  FOLLOWUP APPOINTMENTS:  The patient  is to follow up with her ophthalmologist.  The patient will call Ophthalmology to schedule that appointment.  The patient is asked to follow up with Dr. Benjamin Stain on Friday May 11 at 2:30 p.m. at [facility].  Finally, the patient will be seen by Luretha Murphy on May 7 for her Boniva injection which was missed while the patient was in-house.  DISCHARGE CONDITION:  The patient was discharged home with her daughter in stable medical condition with 24-hour supervision and home health physical therapy.    ______________________________ Demetria Pore, MD   ______________________________ Pearlean Brownie, M.D.    JM/MEDQ  D:  12/17/2010  T:  12/18/2010  Job:  [id number]  cc:   Monica Becton, MD  Electronically Signed by Demetria Pore MD on 12/19/2010 11:44:22 AM Electronically Signed by Pearlean Brownie M.D. on 01/17/2011 09:35:58 AM

## 2011-01-17 NOTE — Discharge Summary (Signed)
NAMEKATHYE, [name]             ACCOUNT NO.:  [id number]  MEDICAL RECORD NO.:  [id number]           PATIENT TYPE:  I  LOCATION:  4507                         FACILITY:  [facility]  PHYSICIAN:  Pearlean Brownie, M.D.DATE OF BIRTH:  [birth year]  DATE OF ADMISSION:  12/25/2010 DATE OF DISCHARGE:  12/28/2010                              DISCHARGE SUMMARY   PRIMARY CARE PROVIDER:  Ihor Austin. Benjamin Stain, MD at [facility].  DISCHARGE DIAGNOSES: 1. Failure to thrive. 2. Iron-deficiency anemia. 3. Herpes ophthalmicus, resolving. 4. Deconditioning. 5. Hypertension. 6. Hyperlipidemia. 7. Coronary artery disease. 8. Depression. 9. Tobacco use. 10.Osteoporosis.  DISCHARGE MEDICATIONS: 1. Acetaminophen 650 mg p.o. q.6 h p.r.n. pain. 2. Artificial tears t.i.d. 3. Lidoderm 5% patch 1-2 patches topically daily p.r.n. pain. 4. Zofran 4 mg p.o. q.6 h p.r.n. 5. Lyrica 75 mg p.o. daily. 6. Boniva injection, 1 injection IM q.3 months. 7. Flonase 1 spray b.i.d. p.r.n. allergies. 8. Ganciclovir eyedrops, 1 drop left eye 5 times per day. 9. Vicodin 5/325 one tablet p.o. q.6 h p.r.n. pain. 10.Lisinopril 10 mg p.o. q.a.m. 11.Boost drink 1 can t.i.d. before meals. 12.Mirtazapine 15 mg p.o. daily. 13.Primidone 50 mg p.o. nightly. 14.Prednisolone 1% ophthalmic solution 1 drop left eye b.i.d., last     dose today. 15.Simvastatin 40 mg p.o. nightly. 16.Toprol-XL 100 mg p.o. q.a.m. 17.Tums 4 tablets p.o. q.a.m. 18.Vitamin B12 1 tablet p.o. daily. 19.Vitamin C 500 mg p.o. daily. 20.Vitamin D 1000 units p.o. daily. 21.Zofran 4 mg p.o. q.6 h p.r.n.  Medications which were stopped during this hospitalization include: 1. Aspirin 81 mg p.o. daily. 2. Acyclovir 2 tablets p.o. 5 times a day as that course was     completed.  CONSULTS:  Nutrition.  PROCEDURES:  Lumbar spine x-ray showing no visible fractures  LABORATORY DATA:  On admission, the patient with a slightly  elevated white count of 13.1 with 81% neutrophils and an ANC of 10.6, hemoglobin of 9.7.  BMET unremarkable with a creatinine of 1.17, alk phos mildly elevated at 165, ALT mildly elevated at 50, albumin low at 2.9.  Prior to discharge, the patient's creatinine had improved to 0.85 and white blood cell count had decreased to 9.0, hemoglobin remained relatively stable at 8.4.  Anemia study was done, which showed an iron of 11, TIBC 225, percent saturation 5, all of which were low.  Vitamin B12, folate, and ferritin were all within normal limits.  Urinalysis on admission was negative with a negative urine culture.  Blood cultures were no growth to date x2 separate occasions at the time of discharge.  BRIEF HOSPITAL COURSE:  This is a 77 year old female with failure to thrive hypertension, hyperlipidemia, anemia, herpes ophthalmicus, presenting with fevers and dehydration, 1. Fevers.  No source was identified.  The patient was afebrile times     greater than 24 hours prior the day of discharge.  Thoughtprocesses included bacterial process versus viral process versus     malignancy.  Bacterial process is unlikely as the patient's chest x-     ray did not show any infiltrates.  Blood cultures were negative x2     separate  occasions at fever spikes and urine culture was negative     with a negative UA.  This is most likely thought to be due her     viral process, however, malignancy cannot be ruled out as the     patient did have an elevated sed rate to 130 and has an anemia of     unknown origin, although she was found to have iron deficiency and     has been anemic since joining our practice in 2010.  The patient's     reticulocyte count was low at 0.9% implying that she was not     appropriately responding to her low hemoglobin which is likely     secondary to being iron deficient.  The patient did spike multiple     fevers initially during the beginning of her hospital course,     however,  did not show any systemic signs or symptoms of infection.     The patient's vital signs remained stable.  No antibiotics were     started as no source was found and it was thought to most likely be     a viral process. 2. Failure to thrive.  The patient has had weight loss and low albumin     for some time now.  She was continued on her Ensure supplements     t.i.d.  This has been worked up as an outpatient by Dr.     Benjamin Stain.  Nutrition was consulted.  No acute changes were made     for the patient's failure to thrive; however, with these new fevers     without source, an elevated sed rate, and anemia.  Again,     malignancy cannot be ruled out as a source of failure to thrive,     however, the patient has no desire to proceed with any aggressive     workup and refuses colonoscopy at this time as that would be the     most likely source of cancer in a 77 year old female who has never     had a colonoscopy. 3. Anemia.  The patient's hemoglobin remained stable in the mid 8,     initially on admission was 9.7 and dropped to 8.3, however, this is     likely due to the patient being hemoconcentrated on admission.     Anemia panel shows that the patient is iron deficient despite her     anemia being normocytic with an MCV of 87.5.  Fecal occult blood     was ordered, but was pending at the time of discharge.  The patient     was started on iron 3 times a day and will be monitored.  It     appears that this anemia could also be secondary to her failure to     thrive and just poor overall nutritional status. 4. Herpes ophthalmicus.  The patient with infection greatly improved     from last hospitalization.  She is still on her eye drops per     Ophthalmology; however, has finished her p.o. prednisone and     acyclovir.  Acyclovir could also be a possible cause for all cell     lines being low including this anemia.  The patient was still     having some dermatomal neuropathic pain, so  Lyrica was started.     The patient states that this greatly helped her pain. 5. Chronic medical problems hypertension, hyperlipidemia,  osteoporosis, coronary artery disease.  The patient was continued     on all of her home medication.  Blood pressures remained stable.     The patient's daily baby aspirin was stopped due to concern of GI     upset and rather than starting an additional medication, a PPI to     decrease the risk of GI complications from a baby aspirin, it was     felt that it will be more fruitful to discontinue the aspirin as     the patient is likely not getting a huge morbidity-mortality     benefit from this.  FOLLOWUP APPOINTMENTS:  The patient is to follow up with Dr. Benjamin Stain in 2-3 weeks.  She will call for an appointment.  DISCHARGE CONDITION:  The patient was discharged to SNF in stable medical condition with instructions to continue with physical therapy, walking with assistance and increasing activity slowly.  In addition, she was greatly encouraged to continue her t.i.d. supplements to help with her failure to thrive.  She was also instructed to stop smoking.    ______________________________ Demetria Pore, MD   ______________________________ Pearlean Brownie, M.D.    JM/MEDQ  D:  12/28/2010  T:  12/28/2010  Job:  [id number]  cc:   Monica Becton, MD  Electronically Signed by Demetria Pore MD on 01/01/2011 11:46:04 AM Electronically Signed by Pearlean Brownie M.D. on 01/17/2011 09:36:08 AM

## 2011-01-22 ENCOUNTER — Ambulatory Visit (INDEPENDENT_AMBULATORY_CARE_PROVIDER_SITE_OTHER): Payer: PRIVATE HEALTH INSURANCE | Admitting: Sports Medicine

## 2011-01-22 ENCOUNTER — Encounter: Payer: Self-pay | Admitting: Sports Medicine

## 2011-01-22 DIAGNOSIS — R627 Adult failure to thrive: Secondary | ICD-10-CM

## 2011-01-22 DIAGNOSIS — B0239 Other herpes zoster eye disease: Secondary | ICD-10-CM

## 2011-01-22 DIAGNOSIS — B0230 Zoster ocular disease, unspecified: Secondary | ICD-10-CM

## 2011-01-22 LAB — COMPREHENSIVE METABOLIC PANEL
ALT: 9 U/L (ref 0–35)
AST: 18 U/L (ref 0–37)
Albumin: 4.3 g/dL (ref 3.5–5.2)
BUN: 31 mg/dL — ABNORMAL HIGH (ref 6–23)
CO2: 24 mEq/L (ref 19–32)
Calcium: 9.4 mg/dL (ref 8.4–10.5)
Chloride: 105 mEq/L (ref 96–112)
Creat: 1.17 mg/dL — ABNORMAL HIGH (ref 0.50–1.10)
Potassium: 4 mEq/L (ref 3.5–5.3)

## 2011-01-22 LAB — TSH: TSH: 0.618 u[IU]/mL (ref 0.350–4.500)

## 2011-01-22 LAB — CBC
Hemoglobin: 10.2 g/dL — ABNORMAL LOW (ref 12.0–15.0)
MCH: 29.4 pg (ref 26.0–34.0)
RBC: 3.47 MIL/uL — ABNORMAL LOW (ref 3.87–5.11)

## 2011-01-22 MED ORDER — MELOXICAM 15 MG PO TABS: 15.0000 mg | ORAL_TABLET | Freq: Every day | ORAL | Status: DC

## 2011-01-22 NOTE — Assessment & Plan Note (Addendum)
Recheck CBC, CMET, tsh. Pt pale and fatigued.  Anemic but greatly improved hb 8->10.  Suspect this will cont to improve. Likely related to marrow suppression from anti-herpetics.

## 2011-01-22 NOTE — Patient Instructions (Signed)
Great to see you, Checking bloodwork. STOP LYRICA. Take meloxicam for pain. Call the office and leave a message in a week or 2 letting us know if you feel better after taking the meloxicam.  Ihor Austin. Benjamin Stain, M.D. [facility] [address] [place] [address] [phone]

## 2011-01-22 NOTE — Assessment & Plan Note (Addendum)
Improved. Post-herpetic neuralgia debilitating but lyrica makes her feel drowsy, disconnected. Gabapentin has a similar mechanism of action and I am avoiding this so we don't cause similar symptoms. Will try mobic daily.  She may also use a half tab of her hydrocodone for breakthrough. Pt to call and let me know how symptoms are doing.

## 2011-01-22 NOTE — Progress Notes (Signed)
  Subjective:    Patient ID: SAMAR VENNEMAN, female    DOB: 1932-02-22, 77 y.o.   MRN: [id number]  HPI She is now a couple of months out from her hospitalization for herpes zoster ophthalmicus.  She was DC'ed to SNF.  Has been home for a week now.  Feeling disconnected, difficult to describe how she feels.  Feels like legs don't move when she wants.  Drowsy, holds to wall to walk, no falls.  Unclear whats causing this.  Feels like she isnt "getting better."  Still with some pain around eye, burning in nature.  Lyrica helps only a little.   Review of Systems    See HPI Objective:   Physical Exam  Constitutional:       Frail elderly white female.  Looks somewhat pale.  HENT:  Head: Normocephalic and atraumatic.  Eyes: Conjunctivae and EOM are normal. Pupils are equal, round, and reactive to light. Right eye exhibits no discharge. Left eye exhibits no discharge. No scleral icterus.  Neck: Neck supple. No thyromegaly present.  Cardiovascular: Normal rate, regular rhythm and normal heart sounds.  Exam reveals no gallop and no friction rub.   No murmur heard. Pulmonary/Chest: Effort normal and breath sounds normal. No respiratory distress. She has no wheezes. She has no rales. She exhibits no tenderness.  Lymphadenopathy:    She has no cervical adenopathy.  Skin: Skin is warm and dry.          Assessment & Plan:

## 2011-02-15 ENCOUNTER — Encounter: Payer: Self-pay | Admitting: Sports Medicine

## 2011-02-15 NOTE — Telephone Encounter (Signed)
This encounter was created in error - please disregard.

## 2011-02-26 ENCOUNTER — Telehealth: Payer: Self-pay | Admitting: Family Medicine

## 2011-02-26 DIAGNOSIS — I10 Essential (primary) hypertension: Secondary | ICD-10-CM

## 2011-02-26 MED ORDER — METOPROLOL SUCCINATE ER 100 MG PO TB24: 100.0000 mg | ORAL_TABLET | Freq: Every day | ORAL | Status: DC

## 2011-02-26 NOTE — Telephone Encounter (Signed)
rx was refilled, notified patient that it was refilled ---[place]

## 2011-02-26 NOTE — Telephone Encounter (Signed)
Needs refill for Toprol, the problem is the prescription has a different doctor listed on it.  [facility] in [place], [phone].  Wants to reiterate, she is completely out of this medication.

## 2011-03-11 NOTE — H&P (Signed)
[name], Watts NO.:  [id number]  MEDICAL RECORD NO.:  [id number]  LOCATION:                                 FACILITY:  PHYSICIAN:  Pearlean Brownie, M.D.DATE OF BIRTH:  November 10, 1931  DATE OF ADMISSION: DATE OF DISCHARGE:                             HISTORY & PHYSICAL   ATTENDING PHYSICIAN:  [facility].  PRIMARY CARE PROVIDER:  Monica Becton, MD  PRIMARY OPHTHALMOLOGIST:  Delon Sacramento, MD  CHIEF COMPLAINT:  Herpes ophthalmicus, and nausea and vomiting.  HISTORY OF PRESENT ILLNESS:  A 77 year old female, evaluated 1 week ago for herpes ophthalmicus of the left eye.  Note, the patient is status post left cataract and lens replacement surgery November 20, 2010.  The patient was started on acyclovir p.o. five times a day and seen by her ophthalmologist 24 hours later.  Ophthalmologist started Zirgan/ganciclovir drops with continued prednisone drop from her previous surgery.  The patient presented to PCP office today with continued eye and facial pain which has worsened as well as nausea, vomiting, inability to tolerate oral acyclovir.  Emesis has been nonbloody, nonbilious for 2-3 times a day associated with epigastric pain.  The patient also noted a few loose stools which have been nonbloody for the past few days.  She denies fever, other rash, shortness of breath, dysuria.  She does admit to generalized weakness and her daughter has noticed that she is more frail and weak than typical and appears very dehydrated.  The patient also admitted to occasional substernal chest pain, nonradiating, associated with nausea, which lasted few seconds.  She denies any diaphoresis with this.  REVIEW OF SYSTEMS:  Per above.  PAST MEDICAL HISTORY:  Hypertension, coronary artery disease, hyperlipidemia, essential tremor, allergic rhinitis, osteoporosis, osteoarthritis, failure to thrive, weight loss.  PAST SURGICAL HISTORY: 1. Left  cataract removal and lens replacement April 2012. 2. BTL. 3. Coronary artery disease, status post stent in 2001. 4. Cholecystectomy.  ALLERGIES:  No known drug allergies.  MEDICATIONS: 1. Aspirin 81 mg daily. 2. Calcium carbonate 500 mg daily. 3. Vitamin D 1000 units daily. 4. Flonase 50 mcg 1 spray each nostril as needed. 5. Boniva injection q.3 months. 6. Lisinopril 10 mg daily. 7. Metoprolol 100 mg daily. 8. Remeron 15 mg nightly. 9. Mysoline 50 mg nightly. 10.Zocor 40 mg daily. 11.Acyclovir 800 mg five times a day.  FAMILY HISTORY:  Noncontributory.  SOCIAL HISTORY:  The patient lives alone in apartment in [place]. Daughter is her primary care giver.  Ambulates cautiously at baseline. Tobacco, half a pack per day started in 1952.  Occasional EtOH.  No illicit.  PHYSICAL EXAMINATION:  VITAL SIGNS:  Temperature 98.8, heart rate 84, respiratory rate 20, blood pressure 158/102, weight 98 pounds. GENERAL:  Elderly, frail, thin-appearing female.  Positive fatigue and ill appearing. HEENT: Vesicular rash on the left nasal bridge up to the frontal hairline and left ear.  Positive erythema.  Dry mucous membranes. Conjunctivae clear.  PERRL.  No discharge from eyes.  Extraocular motions intact.  There is decreased vision in the right eye.  Arcus senilis. NECK:  Supple. RESPIRATORY:  CTAB. CVS:  Regular rate and rhythm.  No murmur.  ABDOMEN:  Normoactive bowel sounds, soft, mild tenderness to palpation in the epigastric region.  No mass.  No guarding.  No rebound. EXTREMITIES: No edema.  Pulses 2+. PSYCHIATRIC:  Slow to respond to questions, but oriented to person and place.  ASSESSMENT/PLAN:  A 77 year old female, admitted with herpes ophthalmicus, nausea, vomiting, dehydration. 1. Herpes zoster.  The patient with ophthalmic herpes, unable to     tolerate p.o. antivirals.  We will start IV acyclovir.  I discussed     with her ophthalmologist and recommends continuing  ganciclovir     drops for total of 2 weeks and continuing prednisone drops as well.     We will add oral steroids to help with pain.  If any change in her     eye exam or subjective eye findings, we will have Ophthalmology     consult inpatient.  Per Annia Belt, MD, no corneal     ulceration seen on previous exam. 2. Nausea and vomiting.  The patient with dehydration status post     recurrent nausea and vomiting.  This could be multifactorial     secondary to medications such as the multiple doses of acyclovir as     well as the current viral infection in elderly female who already     has a history of failure to thrive.  We will start antiemetics, IV     fluids, give a small bolus, and check lytes. 3. Chest pain, atypical in nature, but the patient with coronary     artery disease and chest discomfort.  Likely, this is from her     retching with nausea.  However, we will obtain a cardiac enzymes,     EKG, chest x-ray as she has high-risk comorbidities. 4. Failure to thrive.  The patient has had a significant weight loss.     This is being worked up by her primary care provider as an     outpatient.  She is currently on Remeron.  For now, we will give IV     fluids, PT/OT to see her.  She will likely need 24-hour care and     the family agrees that it is not really best for her to stay in     [place] by herself. 5. FEN/GI:  PO ad lib.  Check labs. 6. Prophylaxis.  PPI and Lovenox. 7. Full code. 8. Disposition, likely to skilled nursing facility upon evaluation.     Milinda Antis, MD   ______________________________ Pearlean Brownie, M.D.    KD/MEDQ  D:  12/13/2010  T:  12/14/2010  Job:  [id number]  Electronically Signed by Pearlean Brownie M.D. on 03/11/2011 01:57:04 PM

## 2011-05-03 ENCOUNTER — Ambulatory Visit (INDEPENDENT_AMBULATORY_CARE_PROVIDER_SITE_OTHER): Payer: PRIVATE HEALTH INSURANCE | Admitting: Family Medicine

## 2011-05-03 ENCOUNTER — Encounter: Payer: Self-pay | Admitting: Family Medicine

## 2011-05-03 DIAGNOSIS — M810 Age-related osteoporosis without current pathological fracture: Secondary | ICD-10-CM

## 2011-05-06 NOTE — Assessment & Plan Note (Signed)
Will let Dr. Gwendolyn Grant know that there has been a minor bump in serum creatinine.  However this puts her into the abnormal range.  Of course the could be other reasons but we want to keep a check on this.

## 2011-05-06 NOTE — Progress Notes (Signed)
  Subjective:    Patient ID: [name], female    DOB: 1932-06-30, 77 y.o.   MRN: [id number]  HPI Patient is here for IVP Boniva.  She reports having shingles of her face and eye in the past few months.  She has recovered.  She denies any bone pain in her legs.  She continues to tolerate the Boniva.   Review of Systems     Objective:   Physical Exam  Thin, frail. Serum Creatinine bumped up in June to 1.17 from below one 6 months prior. She has been receiving IV Boniva since 12/2008 for a Dexa Scan of -2.9 of LS spine and -2.0 of femoral head.  She has tolerated the medication.      Assessment & Plan:  Osteoporosis:  Only recommend total of 5 years of bisphosphate therapy.  She is a 21/2.  However she has showed a mild bump in her serum creatinine.  She will need a BMET with next appointment with her primary MD.   Need to consider chronic use of NSAIDS (Mobic) in the elderly as a factor.

## 2011-08-02 ENCOUNTER — Ambulatory Visit: Payer: PRIVATE HEALTH INSURANCE

## 2011-08-02 ENCOUNTER — Other Ambulatory Visit: Payer: PRIVATE HEALTH INSURANCE

## 2011-08-02 ENCOUNTER — Other Ambulatory Visit: Payer: Self-pay | Admitting: Family Medicine

## 2011-08-02 DIAGNOSIS — R7989 Other specified abnormal findings of blood chemistry: Secondary | ICD-10-CM

## 2011-08-02 NOTE — Progress Notes (Signed)
Bmp done today [name] 

## 2011-08-03 LAB — BASIC METABOLIC PANEL WITH GFR
BUN: 20 mg/dL (ref 6–23)
CO2: 25 meq/L (ref 19–32)
Calcium: 9 mg/dL (ref 8.4–10.5)
Chloride: 109 meq/L (ref 96–112)
Creat: 1.15 mg/dL — ABNORMAL HIGH (ref 0.50–1.10)
Glucose, Bld: 91 mg/dL (ref 70–99)
Potassium: 4 meq/L (ref 3.5–5.3)
Sodium: 139 meq/L (ref 135–145)

## 2011-08-05 ENCOUNTER — Telehealth: Payer: Self-pay | Admitting: *Deleted

## 2011-08-05 ENCOUNTER — Telehealth: Payer: Self-pay | Admitting: Family Medicine

## 2011-08-05 NOTE — Telephone Encounter (Signed)
Will fwd. To Dr.Walden. See previous message Larita Fife) .Arlyss Repress

## 2011-08-05 NOTE — Telephone Encounter (Signed)
Ms. Leandrew Koyanagi would like to talk to Dr. Gwendolyn Grant about the test that were done.  She wants to know if it was for something serious.

## 2011-08-05 NOTE — Telephone Encounter (Signed)
Patient notified that  Dr. Gwendolyn Grant wants her to schedule appointment to follow up with him about elevated kidney tests. Appointment scheduled for 08/27/2011. Will hold off on Boniva for now.

## 2011-08-06 NOTE — Telephone Encounter (Signed)
Called and discussed creatinine levels with patient.  She was worried it was something serious.  Discussed that she should come in to see me and we will discuss Boniva treatments, Mobic use, and recent "bump" in Creatinine.  Patient appreciative and put at ease.

## 2011-08-20 DIAGNOSIS — H3530 Unspecified macular degeneration: Secondary | ICD-10-CM

## 2011-08-20 DIAGNOSIS — H348192 Central retinal vein occlusion, unspecified eye, stable: Secondary | ICD-10-CM

## 2011-08-20 HISTORY — DX: Unspecified macular degeneration: H35.30

## 2011-08-20 HISTORY — DX: Central retinal vein occlusion, unspecified eye, stable: H34.8192

## 2011-08-27 ENCOUNTER — Ambulatory Visit (INDEPENDENT_AMBULATORY_CARE_PROVIDER_SITE_OTHER): Payer: PRIVATE HEALTH INSURANCE | Admitting: Family Medicine

## 2011-08-27 ENCOUNTER — Encounter: Payer: Self-pay | Admitting: Family Medicine

## 2011-08-27 VITALS — BP 173/82 | HR 87 | Temp 97.3°F | Wt 104.0 lb

## 2011-08-27 DIAGNOSIS — I10 Essential (primary) hypertension: Secondary | ICD-10-CM

## 2011-08-27 DIAGNOSIS — M810 Age-related osteoporosis without current pathological fracture: Secondary | ICD-10-CM

## 2011-08-27 MED ORDER — METOPROLOL TARTRATE 50 MG PO TABS: 50.0000 mg | ORAL_TABLET | Freq: Once | ORAL | Status: DC

## 2011-08-27 NOTE — Assessment & Plan Note (Signed)
Recheck creatinine today, kidney function -- if abnormal -- may also be contributing to hypertension urgency. Advised to stop Meloxicam and use Tylenol for relief if needed.

## 2011-08-27 NOTE — Progress Notes (Signed)
  Subjective:    Patient ID: [name], female    DOB: 02/09/1932, 78 y.o.   MRN: [id number]  HPI 1.  Discussion about Boniva:  78 yo F who had recent slight elevations in creatinine from 0.8 to 1.15.  She has been on Boniva for past several years based on recommendations from previous PCP.  Would like to restart Boniva but is worried about kidney function.  She takes Meloxicam daily but does not know why.  Denies any pain/arthralgias, myalgias, residual pain from Zoster.  2. Hypertension:  History of this, previously controlled until September.  Acutely elevated today, recheck showed 188/85. Took medications at home today as per usual.  Denies any salt use at home added to food. Not checking it regularly.  No HA, CP, dizziness, shortness of breath, palpitations, or LE swelling.   BP Readings from Last 3 Encounters:  08/27/11 173/82  05/03/11 161/70  01/22/11 122/60       Review of Systems See HPI above for review of systems.       Objective:   Physical Exam Gen:  Alert, cooperative patient who appears stated age in no acute distress.  Vital signs reviewed. Neck: No masses or thyromegaly or limitation in range of motion.  No cervical lymphadenopathy. Cardiac:  Regular rate and rhythm without murmur auscultated.  Good S1/S2. Pulm:  Clear to auscultation bilaterally with good air movement.  No wheezes or rales noted.   Abd:  Soft/nondistended/nontender.  Good bowel sounds throughout all four quadrants.  No masses noted.  Ext:  No clubbing/cyanosis/erythema.  No edema noted bilateral lower extremities.          Assessment & Plan:

## 2011-08-27 NOTE — Patient Instructions (Signed)
Take the extra Metoprolol in the morning.  It will be a 50 mg dose.   We will check your kidney function again today. Come back on Thursday for a blood pressure check.  If your kidneys are doing well we can do the Boniva then.   Stop taking the Meloxicam.  If you have pain, take Tylenol for relief.

## 2011-08-27 NOTE — Assessment & Plan Note (Addendum)
Hypertensive urgency today even on recheck as per HPI.  No signs or symptoms of end-stage disease.   Will add short acting Metoprolol to regimen.  May need additional anti-hypertensive med versus increase in dose.  FU on Thursday to assess for improvement. Discussed with Dr. [place] who agreed with plan.   Will discuss with patients lab results as well as recheck of blood pressure when I see her Friday unless elevated labs, then will call her.

## 2011-08-28 LAB — COMPREHENSIVE METABOLIC PANEL
ALT: 12 U/L (ref 0–35)
AST: 21 U/L (ref 0–37)
Albumin: 4.1 g/dL (ref 3.5–5.2)
Alkaline Phosphatase: 103 U/L (ref 39–117)
Calcium: 9.1 mg/dL (ref 8.4–10.5)
Chloride: 106 mEq/L (ref 96–112)
Potassium: 3.9 mEq/L (ref 3.5–5.3)
Sodium: 141 mEq/L (ref 135–145)
Total Protein: 7 g/dL (ref 6.0–8.3)

## 2011-08-28 LAB — CBC
MCH: 32 pg (ref 26.0–34.0)
MCHC: 33.7 g/dL (ref 30.0–36.0)
Platelets: 217 10*3/uL (ref 150–400)
RDW: 14 % (ref 11.5–15.5)

## 2011-08-29 ENCOUNTER — Other Ambulatory Visit: Payer: PRIVATE HEALTH INSURANCE

## 2011-08-29 ENCOUNTER — Ambulatory Visit (INDEPENDENT_AMBULATORY_CARE_PROVIDER_SITE_OTHER): Payer: PRIVATE HEALTH INSURANCE | Admitting: *Deleted

## 2011-08-29 VITALS — BP 180/90 | HR 60

## 2011-08-29 DIAGNOSIS — I10 Essential (primary) hypertension: Secondary | ICD-10-CM

## 2011-08-29 NOTE — Progress Notes (Signed)
Consulted first with Dr.Breen  And he advises for patient to continue current meds and follow up with Dr. Gwendolyn Grant tomorrow. Patient advised and appointment scheduled for tomorrow afternoon.   Dr. Gwendolyn Grant returned page and advised him of Dr. Marinell Blight recommendation.

## 2011-08-29 NOTE — Progress Notes (Signed)
Patient in for BP check today. BP checked manually using regular adult cuff. LA 180/92 and RA 200/96, pulse 60. She is taking medications as prescribed. Will contact Dr. Gwendolyn Grant for further instructions

## 2011-08-30 ENCOUNTER — Ambulatory Visit (INDEPENDENT_AMBULATORY_CARE_PROVIDER_SITE_OTHER): Payer: PRIVATE HEALTH INSURANCE | Admitting: Family Medicine

## 2011-08-30 ENCOUNTER — Encounter: Payer: Self-pay | Admitting: Family Medicine

## 2011-08-30 DIAGNOSIS — Z1211 Encounter for screening for malignant neoplasm of colon: Secondary | ICD-10-CM

## 2011-08-30 DIAGNOSIS — I10 Essential (primary) hypertension: Secondary | ICD-10-CM

## 2011-08-30 MED ORDER — HYDROCHLOROTHIAZIDE 12.5 MG PO CAPS: 12.5000 mg | ORAL_CAPSULE | Freq: Every day | ORAL | Status: DC

## 2011-08-30 MED ORDER — ZOSTER VACCINE LIVE 19400 UNT/0.65ML ~~LOC~~ SOLR: 0.6500 mL | Freq: Once | SUBCUTANEOUS | Status: AC

## 2011-08-30 NOTE — Patient Instructions (Signed)
Come back in 2 weeks for lab work and a blood pressure check. Do not take any more Meloxicam.   The new blood pressure medicine is called Hydrochlorothiazide.   The good news is that if this new blood pressure medication works, there is a combination pill with Lisinopril.

## 2011-08-30 NOTE — Assessment & Plan Note (Signed)
Much improved.   Plan to continue slow lowering of blood pressure.   Add HCTZ to regimen, pulse 69 today, worry about lowering pulse if increasing dose of Metoprolol.  No increase to Lisionpril due to slight bump of creatinine.   FU 2 weeks for BP check and creatinine at that time.

## 2011-08-30 NOTE — Progress Notes (Signed)
  Subjective:    Patient ID: [name], female    DOB: Jan 01, 1932, 78 y.o.   MRN: [id number]  HPI FU for hypertensive urgency:  New issue for patient last visit.  BP had previously been under control.  Taking Metoprolol and Lisinopril regularly, corroborated by daughter present at visit. Prescribed extra dose short acting Metoprolol.  BP readings for today as below, as well as nurse visit from yesterday.  No HA, CP, dizziness, shortness of breath, palpitations, or LE swelling.   BP Readings from Last 3 Encounters:  08/30/11 140/80  08/29/11 180/90  08/27/11 173/82      Review of Systems See HPI above for review of systems.       Objective:   Physical Exam Gen:  Alert, cooperative patient who appears stated age in no acute distress.  Vital signs reviewed. Cardiac:  Regular rate and rhythm without murmur auscultated.  Good S1/S2. Pulm:  Clear to auscultation bilaterally with good air movement.  No wheezes or rales noted.   Abd:  Soft/nondistended/nontender.  Good bowel sounds throughout all four quadrants.  No masses noted.  Ext:  No clubbing/cyanosis/erythema.  No edema noted bilateral lower extremities.          Assessment & Plan:

## 2011-09-03 ENCOUNTER — Telehealth: Payer: Self-pay | Admitting: Family Medicine

## 2011-09-03 NOTE — Telephone Encounter (Signed)
Called to ask patient to come in this week for blood pressure check after thinking about her case a little more.  However, was pleased to learn she has been taking her BP measurements at home.  They are as follows: Friday 151/68 Pulse 63 Saturday 125/68 Pulse 78 Sunday 132/ 62 Pulse 72 With these home measurements I feel more at ease.  I recommended she bring her blood pressure monitor to her next BP check here at the office so we can compare the two.  She agreed.  Next visit is 09/13/11.

## 2011-09-05 ENCOUNTER — Telehealth: Payer: Self-pay | Admitting: Family Medicine

## 2011-09-05 LAB — HEMOCCULT GUIAC POC 1CARD (OFFICE)
Card #2 Fecal Occult Blod, POC: NEGATIVE
Fecal Occult Blood, POC: NEGATIVE

## 2011-09-05 NOTE — Progress Notes (Signed)
Addended by: [place], Orby Tangen on: 09/05/2011 04:44 PM   Modules accepted: Orders

## 2011-09-05 NOTE — Telephone Encounter (Signed)
BP is 11/62 and pulse 73.

## 2011-09-06 NOTE — Telephone Encounter (Signed)
Is that supposed to be 111/62, or something else, I wasn't sure.

## 2011-09-06 NOTE — Telephone Encounter (Signed)
Sorry, yes, that is 111/62 with pulse of 73.

## 2011-09-09 ENCOUNTER — Encounter: Payer: Self-pay | Admitting: Family Medicine

## 2011-09-09 ENCOUNTER — Ambulatory Visit (INDEPENDENT_AMBULATORY_CARE_PROVIDER_SITE_OTHER): Payer: PRIVATE HEALTH INSURANCE | Admitting: Family Medicine

## 2011-09-09 ENCOUNTER — Telehealth: Payer: Self-pay | Admitting: Family Medicine

## 2011-09-09 DIAGNOSIS — I959 Hypotension, unspecified: Secondary | ICD-10-CM

## 2011-09-09 NOTE — Telephone Encounter (Signed)
Dr. Gwendolyn Grant I spoke with patient and she states that she has been feeling very tired and worn out. She has not had an appetite nor is eating/drinking as well. She says that since she started taking hydrochlorothiazide. She is wondering what she can do.Erma Pinto

## 2011-09-09 NOTE — Telephone Encounter (Signed)
Seeing her in clinic today

## 2011-09-09 NOTE — Telephone Encounter (Signed)
Ms. Cornell would like to speak to the nurse about the symptoms she is having.

## 2011-09-09 NOTE — Telephone Encounter (Signed)
Not feeling well since starting the new bp med.  Bp has been registering below 100.  Daughter advised to stop taking med.  Please contact patient asap.

## 2011-09-10 ENCOUNTER — Ambulatory Visit (INDEPENDENT_AMBULATORY_CARE_PROVIDER_SITE_OTHER): Payer: PRIVATE HEALTH INSURANCE | Admitting: *Deleted

## 2011-09-10 DIAGNOSIS — I959 Hypotension, unspecified: Secondary | ICD-10-CM | POA: Insufficient documentation

## 2011-09-10 DIAGNOSIS — M810 Age-related osteoporosis without current pathological fracture: Secondary | ICD-10-CM

## 2011-09-10 MED ORDER — IBANDRONATE SODIUM 3 MG/3ML IV SOLN
3.0000 mg | Freq: Once | INTRAVENOUS | Status: AC
  Administered 2011-09-10: 3 mg via INTRAVENOUS

## 2011-09-10 NOTE — Progress Notes (Signed)
  Subjective:    Patient ID: [name], female    DOB: [birth year], 78 y.o.   MRN: [id number]  HPI  Also plan to restart Boniva.  Discussion had with patient and daughter regarding this, they are concerned that weakness is from stopping Boniva.  Discussed risks and benefits of the nevi with slightly elevated creatinine. The patient and daughter state they understand and still like to restart the Boniva. They're to return tomorrow a.m. for administration. Gave red flags and warnings regarding what to look out for for kidney failure.  Review of Systems     Objective:   Physical Exam        Assessment & Plan:

## 2011-09-10 NOTE — Progress Notes (Signed)
  Subjective:    Patient ID: [name], female    DOB: 1932-05-03, 78 y.o.   MRN: [id number]  HPI #1. Low blood pressures and general malaise: Patient started hydrochlorothiazide on Thursday. She began experiencing low blood pressures to 103 to 110 systolic Thursday and Friday. Continued to Saturday until her daughter told her to stop taking the hydrochlorothiazide. She states she is still felt a little lightheaded, with body aches since stopping the hydrochlorothiazide. She is worried that she is having something else going on. She denies any cough, fever, chills, nasal congestion.   Review of Systems See HPI above for review of systems.       Objective:   Physical Exam  Gen:  Alert, cooperative patient who appears stated age in no acute distress.  Vital signs reviewed. Mouth:  MMM Neck:  No thyromegaly Cardiac:  Regular rate and rhythm without murmur auscultated.  Good S1/S2. Pulm:  Clear to auscultation bilaterally with good air movement.  No wheezes or rales noted.   Abd:  Thin/nontender.     Assessment & Plan:

## 2011-09-10 NOTE — Progress Notes (Signed)
Patient brings in her home BP monitor.  BP LA 163/76 pulse 76.  BP checked manually with our monitor LA 154/76  Pulse 76.  Boniva given IV today.

## 2011-09-10 NOTE — Assessment & Plan Note (Signed)
Likely related to hydrochlorothiazide use. She does not have any other symptoms of flu illness. She also stopped taking her Mobic about 2 weeks ago which she had been taking every single day. The muscle aches and soreness maybe related that that secondarily. She is going to follow up with me on Friday. Also recommended she stop taking her blood pressure so often. She states has been taking it at least twice a day. Before her blood pressure was elevated last visit she states she never took her blood pressure at home. She'll also going to bring her blood pressure cuff to measure it against the ones here in clinic.

## 2011-09-20 ENCOUNTER — Ambulatory Visit (INDEPENDENT_AMBULATORY_CARE_PROVIDER_SITE_OTHER): Payer: PRIVATE HEALTH INSURANCE | Admitting: Family Medicine

## 2011-09-20 ENCOUNTER — Encounter: Payer: Self-pay | Admitting: Family Medicine

## 2011-09-20 DIAGNOSIS — M545 Low back pain: Secondary | ICD-10-CM

## 2011-09-20 DIAGNOSIS — I10 Essential (primary) hypertension: Secondary | ICD-10-CM

## 2011-09-20 NOTE — Progress Notes (Signed)
  Subjective:    Patient ID: [name], female    DOB: Jun 11, 1932, 78 y.o.   MRN: [id number]  HPI 1.  Hypertension:  Long-term problem for this patient.  No adverse effects from medication.  Not checking it regularly.  No HA, CP, dizziness, shortness of breath, palpitations, or LE swelling.  Blood pressures running 130 - 140 at home. No further episodes of hypotension. No falls  BP Readings from Last 3 Encounters:  09/20/11 158/80  09/09/11 160/78  08/30/11 140/80   2.  Back pain: Patient was vacuuming her entire house 2 days ago. She complained of bilateral lower lumbar pain that started later that evening. Really for 2 doses of Tylenol. She has not taken any Tylenol for that. She's not had any worsening of her back pain. She has not had any incontinence. She simply brought this up so that I would know. Review of Systems See HPI above for review of systems.       Objective:   Physical Exam Gen:  Alert, cooperative patient who appears stated age in no acute distress.  Vital signs reviewed. Cardiac:  Regular rate and rhythm without murmur auscultated.  Good S1/S2. Pulm:  Clear to auscultation bilaterally with good air movement.  No wheezes or rales noted.   MSK:  Minimal tenderness bilaterally over her SI joints. No other back tenderness. No bony spinous process tenderness. Ext:  No clubbing/cyanosis/erythema.  No edema noted bilateral lower extremities.          Assessment & Plan:

## 2011-09-20 NOTE — Assessment & Plan Note (Signed)
No red flags based on exam or history. Nonradiating back pain. Symptomatic treatment especially as she is improving even after just 2 days.

## 2011-09-20 NOTE — Patient Instructions (Signed)
Keep taking your blood pressure medicine as prescribed. At your next visit bring your medicines so we can make sure the computer is up to date. Take Tylenol for back pain

## 2011-09-20 NOTE — Assessment & Plan Note (Signed)
Patient evidently at goal while at home. I would favor permissive hypertension in this patient is not doing well with blood pressures in the 110s. I removed hydrochlorothiazide from her medication list. We'll continue lisinopril and metoprolol.

## 2011-11-20 ENCOUNTER — Ambulatory Visit: Payer: PRIVATE HEALTH INSURANCE

## 2011-11-20 ENCOUNTER — Telehealth: Payer: Self-pay | Admitting: Family Medicine

## 2011-11-20 NOTE — Telephone Encounter (Signed)
Patient was down for Boniva injection today.  Called and advised patient that she is 2 weeks early . Also needs to have labs drawn first. She wants also to schedule appointment with Dr. Gwendolyn Grant for check up . Appointment scheduled 04/11 and will have labs done at that time.

## 2011-11-20 NOTE — Telephone Encounter (Signed)
error 

## 2011-11-28 ENCOUNTER — Encounter: Payer: Self-pay | Admitting: Family Medicine

## 2011-11-28 ENCOUNTER — Ambulatory Visit (INDEPENDENT_AMBULATORY_CARE_PROVIDER_SITE_OTHER): Payer: PRIVATE HEALTH INSURANCE | Admitting: Family Medicine

## 2011-11-28 VITALS — BP 162/80 | HR 84 | Temp 96.9°F | Ht 64.0 in | Wt 106.4 lb

## 2011-11-28 DIAGNOSIS — R799 Abnormal finding of blood chemistry, unspecified: Secondary | ICD-10-CM

## 2011-11-28 DIAGNOSIS — G250 Essential tremor: Secondary | ICD-10-CM

## 2011-11-28 DIAGNOSIS — R7989 Other specified abnormal findings of blood chemistry: Secondary | ICD-10-CM

## 2011-11-28 DIAGNOSIS — M810 Age-related osteoporosis without current pathological fracture: Secondary | ICD-10-CM

## 2011-11-28 DIAGNOSIS — G252 Other specified forms of tremor: Secondary | ICD-10-CM

## 2011-11-28 LAB — BASIC METABOLIC PANEL
BUN: 19 mg/dL (ref 6–23)
CO2: 23 mEq/L (ref 19–32)
Calcium: 9.7 mg/dL (ref 8.4–10.5)
Glucose, Bld: 100 mg/dL — ABNORMAL HIGH (ref 70–99)
Potassium: 3.9 mEq/L (ref 3.5–5.3)
Sodium: 139 mEq/L (ref 135–145)

## 2011-11-28 LAB — CBC
HCT: 36.7 % (ref 36.0–46.0)
MCHC: 31.3 g/dL (ref 30.0–36.0)
MCV: 96.1 fL (ref 78.0–100.0)
Platelets: 240 10*3/uL (ref 150–400)
RDW: 13.8 % (ref 11.5–15.5)

## 2011-11-28 NOTE — Assessment & Plan Note (Signed)
Labs checked today.   Restart Boniva as long as creatinine okay.

## 2011-11-28 NOTE — Patient Instructions (Signed)
I am not going to make any changes to your medicines. Make an appointment to see Dr. Sheffield Slider in Geriatrics clinic for these tremors.  We'll do the labs today. Next week will be fine for the Boniva shot.    I'll see you back in June after you see Dr. Sheffield Slider.

## 2011-11-28 NOTE — Progress Notes (Signed)
  Subjective:    Patient ID: [name], female    DOB: November 27, 1931, 78 y.o.   MRN: [id number]  HPI 1.  Tremor:  Problem for this patient for the past several years. She was placed on primidone 25 mg about a year ago. Initially noted some improvement but now worsening. Intention tremor by description. Worse when trying to use utensils or write checks. She states she has to her daughter at her checks. She wonders if there is anything that can improve her tremor. Daughter also notes that she has some continual persistent movements such as sweeping her legs and rocking in chair. Patient denies any restless leg syndrome and states that her constantly moving legs are not a problem for her.  2.  Labs prior to Boniva: Patient had slightly elevated creatinine back in December. Decision was made to hold Boniva. We can recheck her creatinine one more time if she is okay we'll put her back on regular anemia shots as she was on before.  Review of Systems No fevers or chills.  No falls or syncope.      Objective:   Physical Exam Gen:  Thin, frail elderly female sitting on exam table in NAD.  She does swing her legs continually throughout exam. HEENT:  [place]/AT.  EOMI, PERRL.  MMM, tonsils non-erythematous, non-edematous.  External ears WNL, Bilateral TM's normal without retraction, redness or bulging.  Cardiac:  Regular rate and rhythm without murmur auscultated.  Good S1/S2. Pulm:  Clear to auscultation bilaterally with good air movement.  No wheezes or rales noted.   Neuro: Alert and oriented to person, place, and date.  CN II-XII intact.  Finger to nose somewhat delayed.  No trouble with rapid alternating movements.  Intention tremor noted with patient writing her name, worse the longer she writes.  No tremor at rest.  No asterixis.            Assessment & Plan:

## 2011-11-28 NOTE — Assessment & Plan Note (Signed)
Intention tremor. Not comfortable increasing Primidone dose. Recommend referral to Geriatrics clinic for further evaluation and any recommendations regarding tremor. Also not slight tremor of head at rest.   No extremity resting tremor.

## 2011-12-03 ENCOUNTER — Telehealth: Payer: Self-pay | Admitting: Family Medicine

## 2011-12-03 NOTE — Telephone Encounter (Signed)
error 

## 2011-12-05 ENCOUNTER — Ambulatory Visit (INDEPENDENT_AMBULATORY_CARE_PROVIDER_SITE_OTHER): Payer: PRIVATE HEALTH INSURANCE | Admitting: *Deleted

## 2011-12-05 ENCOUNTER — Encounter: Payer: Self-pay | Admitting: Family Medicine

## 2011-12-05 ENCOUNTER — Ambulatory Visit (INDEPENDENT_AMBULATORY_CARE_PROVIDER_SITE_OTHER): Payer: PRIVATE HEALTH INSURANCE | Admitting: Family Medicine

## 2011-12-05 VITALS — BP 167/82 | HR 70 | Ht 64.0 in | Wt 107.0 lb

## 2011-12-05 DIAGNOSIS — G252 Other specified forms of tremor: Secondary | ICD-10-CM

## 2011-12-05 DIAGNOSIS — M810 Age-related osteoporosis without current pathological fracture: Secondary | ICD-10-CM

## 2011-12-05 DIAGNOSIS — I10 Essential (primary) hypertension: Secondary | ICD-10-CM

## 2011-12-05 DIAGNOSIS — M542 Cervicalgia: Secondary | ICD-10-CM | POA: Insufficient documentation

## 2011-12-05 MED ORDER — IBANDRONATE SODIUM 3 MG/3ML IV SOLN
3.0000 mg | Freq: Once | INTRAVENOUS | Status: AC
  Administered 2011-12-05: 3 mg via INTRAVENOUS

## 2011-12-05 MED ORDER — METOPROLOL SUCCINATE ER 50 MG PO TB24: 50.0000 mg | ORAL_TABLET | Freq: Every day | ORAL | Status: DC

## 2011-12-05 NOTE — Patient Instructions (Signed)
Thank you for coming in today. I do not think you need a brain scan or a visit with the neurologist at this time. I think the Primidone is not helping much.  You can stop it if you want to. I would like you to take an additional 50 mg of metoprolol daily.  So you should take one 100 mg and one 50 mg pill daily. Please follow up with Dr. Gwendolyn Grant in 2-3 weeks. Let us know if you get dizzy or lightheaded. You can also take Tylenol arthritis 650 mg every 8 hours for pain. Make sure to keep your chin back in her shoulder straight when sitting.

## 2011-12-05 NOTE — Assessment & Plan Note (Signed)
Not well controlled

## 2011-12-05 NOTE — Progress Notes (Signed)
[name] is a 78 y.o. female who presents to geriatrics clinic today for evaluation of tremor.  Patient has been diagnosed with a benign essential tremor for the last several years. She is currently being treated with primidone 50 mg each bedtime.  She was recently seen by her primary care provider with worsening tremor and was sent to geriatrics clinic for further evaluation.   [name] notes a subtle tremor of the right hand and head.  Specifically she has a fine tremor with handwriting and using a computer mouse. The tremor causes her difficulty and she is a sometimes unable to perform this function.  Additionally she notes a fine tremor of the head that is sometimes bothersome. When asked she also notes neck pain such that she has to hold her head in a special position sometimes to keep from having pain.  She denies any radiating numbness or pain in her hands.     PMH, SH reviewed: Significant for hypertension coronary artery disease and a tremor ROS as above otherwise neg. No Chest pain, palpitations, SOB, Fever, Chills, Abd pain, N/V/D.  Medications reviewed. Current Outpatient Prescriptions  Medication Sig Dispense Refill  . aspirin 81 MG EC tablet Take 81 mg by mouth daily.        . calcium carbonate (TUMS) 500 MG chewable tablet Chew 4 tablets by mouth daily.        . Cholecalciferol (D-1000) 1000 UNIT tablet Take 1,000 Units by mouth daily.        . fluticasone (FLONASE) 50 MCG/ACT nasal spray 1 spray by Nasal route daily. Each nostril even if not having symptoms       . ibandronate (BONIVA) 3 MG/3ML injection Inject 3 mg into the vein every 3 (three) months.        Marland Kitchen lisinopril (PRINIVIL,ZESTRIL) 10 MG tablet Take 10 mg by mouth daily.        . meloxicam (MOBIC) 15 MG tablet Take 1 tablet (15 mg total) by mouth daily.  30 tablet  2  . metoprolol (TOPROL XL) 100 MG 24 hr tablet Take 1 tablet (100 mg total) by mouth daily.  30 tablet  0  . mirtazapine (REMERON) 15 MG tablet Take  15 mg by mouth at bedtime.      . primidone (MYSOLINE) 50 MG tablet Take 50 mg by mouth at bedtime.      . simvastatin (ZOCOR) 40 MG tablet Take 20 mg by mouth daily.        Marland Kitchen DISCONTD: mirtazapine (REMERON) 15 MG tablet Take 1 tablet (15 mg total) by mouth at bedtime.  30 tablet  11  . DISCONTD: primidone (MYSOLINE) 50 MG tablet Take 1 tablet (50 mg total) by mouth at bedtime.  30 tablet  11  . metoprolol succinate (TOPROL XL) 50 MG 24 hr tablet Take 1 tablet (50 mg total) by mouth daily. Take with or immediately following a meal.  30 tablet  2    Exam:  BP 167/82  Pulse 70  Ht 5\' 4"  (1.626 m)  Wt 107 lb (48.535 kg)  BMI 18.37 kg/m2 Gen: Well NAD elderly appearing woman HEENT: EOMI,  MMM right trapezius muscle tender to palpation.  Head held in a forward position. Neck range of motion is full with the exception of left rotation which is mildly limited Lungs: CTABL Nl WOB Heart: RRR no MRG Abd: NABS, NT, ND Exts: Non edematous BL  LE, warm and well perfused.  Neuro: Alert and oriented x3  cranial nerves intact.  Strength and reflexes are normal and equal bilaterally as is sensation.  Patient does not have resting tremor.  On full in extension and very subtle fine tremor is present. No pronator drift or ataxia.  Gait: Narrow-based gait with diminished arm swing.  Head is held in a slightly tilted and rotated position.  Fine head tremor present with gait.  No results found for this or any previous visit (from the past 72 hour(s)).   I interviewed and examined this patient and discussed the care plan with Dr. Denyse Amass and agree with assessment and plan as documented in the visit note for today.    Wayne A. Sheffield Slider, MD Family Medicine Teaching Service Attending  12/05/2011 3:27 PM

## 2011-12-05 NOTE — Progress Notes (Signed)
Patient in for Boniva  IV. Butterfly IV needle inserted into right antecubital space and Boniva 3mg /3 mL pushed slowly . Patient tolerated procedure well without any problem. She will return in 3 months for labs and if satisfactory will administer again .

## 2011-12-05 NOTE — Assessment & Plan Note (Signed)
Patient has neck pain almost certainly do to arthritis and degenerative disc disease.   Recommend for Tylenol and good posture and positioning. Followup with Dr. Gwendolyn Grant as needed

## 2011-12-05 NOTE — Assessment & Plan Note (Signed)
Appears to be an essential tremor.   No significant abnormalities discovered on exam today.  I agree with Dr. Gwendolyn Grant did not escalate . Primidone beyond 50 mg at night.  She does have room to go up on metoprolol with heart rate and blood pressure.  Plan increase to 150 mg per day and followup with Dr. Gwendolyn Grant in 2 or 3 weeks

## 2011-12-06 ENCOUNTER — Ambulatory Visit: Payer: PRIVATE HEALTH INSURANCE

## 2011-12-10 ENCOUNTER — Telehealth: Payer: Self-pay | Admitting: Family Medicine

## 2011-12-10 NOTE — Telephone Encounter (Signed)
Message left to return call.

## 2011-12-10 NOTE — Telephone Encounter (Signed)
Pt is asking to speak with Dr Gwendolyn Grant about the increase of her Toprol.  She was told by Dr Sheffield Slider to increase by 50mg  and has some questions about this.

## 2011-12-10 NOTE — Telephone Encounter (Signed)
Spoke with patient and she has not started the extra metoprolol yet . At last visit Dr. Sheffield Slider wanted her to increase to 150 mg. She is concerned that her heart rate will get too low. Tried to explain  that because her heart rate usually runs in the 70-80 range Dr. Sheffield Slider thought it would be fine to increase .  She doesn't fully understand and has reluctance to Increase the medication. She wants to speak to Dr. Gwendolyn Grant. Advised will send message to him.

## 2011-12-11 NOTE — Telephone Encounter (Signed)
Called and discussed increased metoprolol dose with patient. She is obviously concerned that it may cause low blood pressure, lightheadedness, or falls. Of course we would want to avoid hypotension in this 78 year old female with known osteoporosis. However did explain to her that Dr. Sheffield Slider is a geriatrician and he and I both believe there is room to go up on both her pulse and blood pressure. I provided the patient with explicit warnings and red flags that would prompt return to clinic or the ED.    She plans to try the increased dose of Metoprolol for the next several days and stop immediately if she has any problems.  Will call and let us know at that point.

## 2011-12-16 ENCOUNTER — Encounter: Payer: Self-pay | Admitting: Family Medicine

## 2011-12-16 ENCOUNTER — Ambulatory Visit
Admission: RE | Admit: 2011-12-16 | Discharge: 2011-12-16 | Disposition: A | Discharge status: Home / Self Care | Payer: PRIVATE HEALTH INSURANCE | Source: Ambulatory Visit | Attending: Family Medicine | Admitting: Family Medicine

## 2011-12-16 ENCOUNTER — Ambulatory Visit (INDEPENDENT_AMBULATORY_CARE_PROVIDER_SITE_OTHER): Payer: PRIVATE HEALTH INSURANCE | Admitting: Family Medicine

## 2011-12-16 VITALS — BP 161/73 | HR 73 | Temp 98.2°F | Ht 64.0 in | Wt 106.0 lb

## 2011-12-16 DIAGNOSIS — M25552 Pain in left hip: Secondary | ICD-10-CM | POA: Insufficient documentation

## 2011-12-16 DIAGNOSIS — M545 Low back pain, unspecified: Secondary | ICD-10-CM

## 2011-12-16 DIAGNOSIS — M25559 Pain in unspecified hip: Secondary | ICD-10-CM | POA: Insufficient documentation

## 2011-12-16 NOTE — Assessment & Plan Note (Addendum)
Acute left hip pain, relieved by tylenol.  Concern for stress/compression fracture given acute onset and hx of osteopenia.  Will get xray of hip and lumbar spine.  Id normal will continue to treat symptomatically with tylenol.  If not resolving or worsens, advised to return for re-evaluation.

## 2011-12-16 NOTE — Progress Notes (Addendum)
  Subjective:    Patient ID: TEIA FREITAS, female    DOB: 1932-03-04, 78 y.o.   MRN: [id number]  Interviewed and examined with MS3.  HPI 3-4 days of abdominal pain  Acute onset, left sided, sometimes radiated to back and to left side. (points to hip)  Reports she has not had this pain before.  No recent fall or overuse.  Worse with walking, no worse with food.    No constipation, diarrhea, nausea, blood in stool.  No dysuria, hematuria, urinary frequency.  I have reviewed patient's  PMH, FH, and Social history and Medications as related to this visit.  Osteoporosis, lumbar back pain.  Review of Systems See HPI    Objective:   Physical Exam GEN: Alert & Oriented, No acute distress No TTP over spine or flanks.  TTP over ASIS.  Right external rotation of hip mildly limited compared to left.          Assessment & Plan:

## 2011-12-16 NOTE — Patient Instructions (Signed)
Will get xrays of your low back and hip  Take extra strength tylenol arthritis for pain  If does not go away or gets worse, please come back for re-evaluation so we can look deeper into the cause of your pain.

## 2011-12-18 IMAGING — CT CT MAXILLOFACIAL W/O CM
3 series · 17 of 47 positions shown, 20 images · non-contrast
Comparison: None.

CLINICAL DATA: Left-sided pain.  Zoster.  Fever and weakness

CT MAXILLOFACIAL WITHOUT CONTRAST
TECHNIQUE: Multidetector CT imaging of the maxillofacial
structures was performed. Multiplanar CT image reconstructions were
also generated.

[Series 2: facial bones · axial · 0.37mm/px · z∈[+17,+143]mm · 11 of 75 slices shown, 14 images]
[im 6/75  brain]
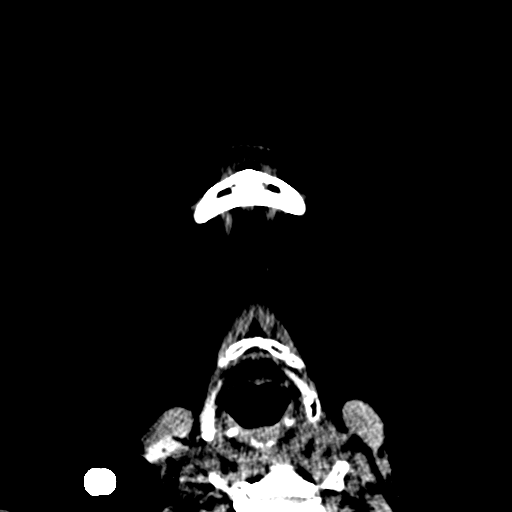
[im 6/75  bone]
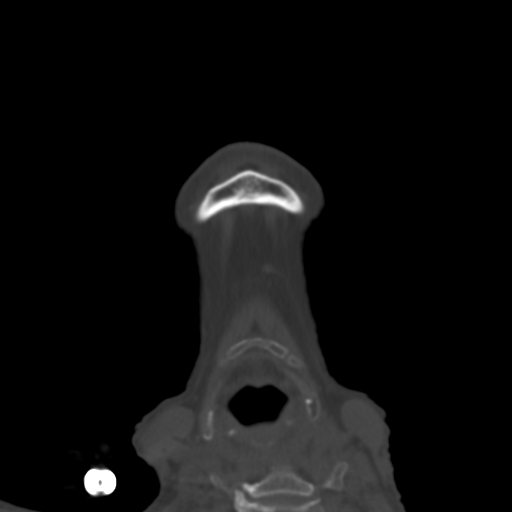
[im 11/75  bone]
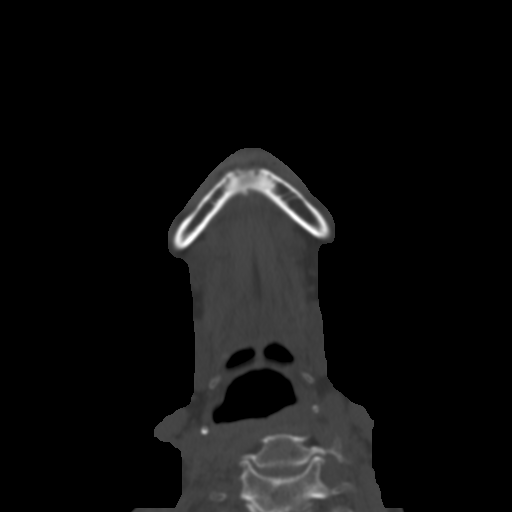
[im 18/75  bone]
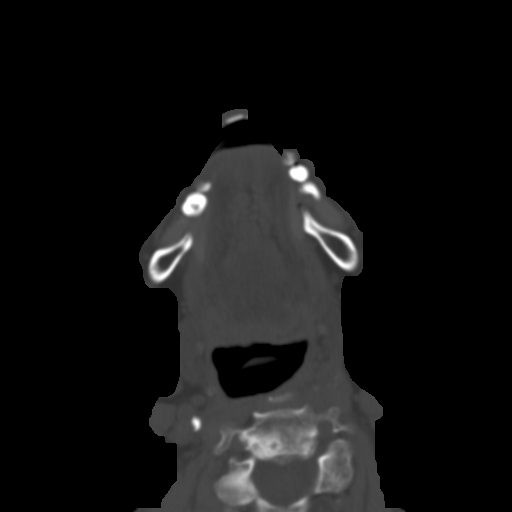
[im 23/75  bone]
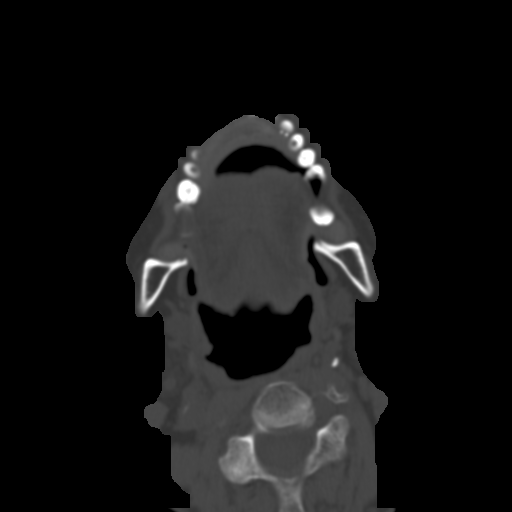
[im 31/75  brain]
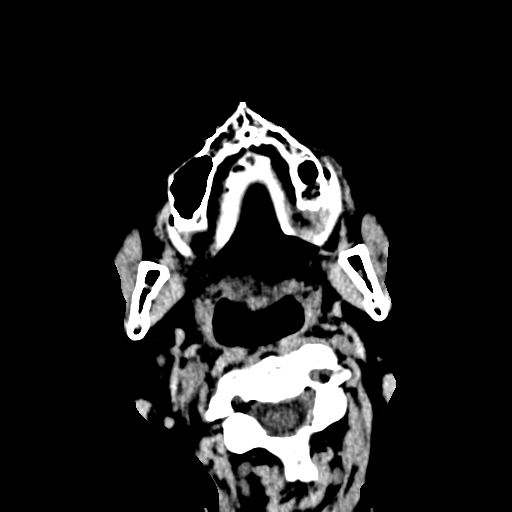
[im 31/75  bone]
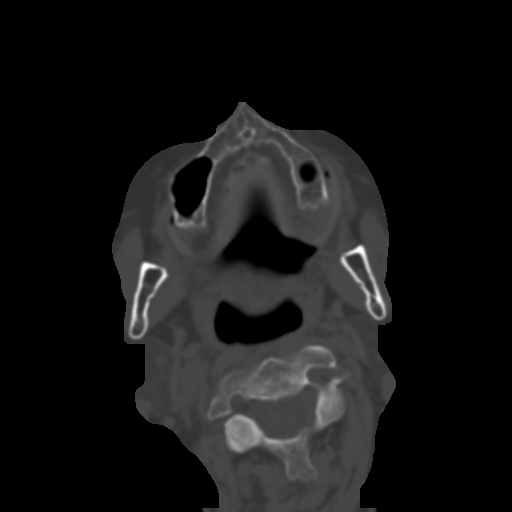
[im 39/75  bone]
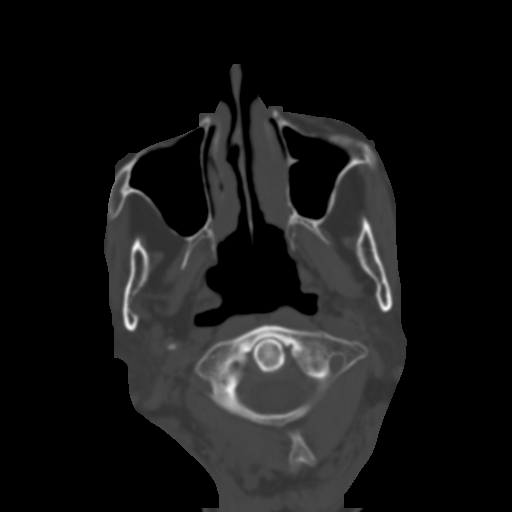
[im 44/75  bone]
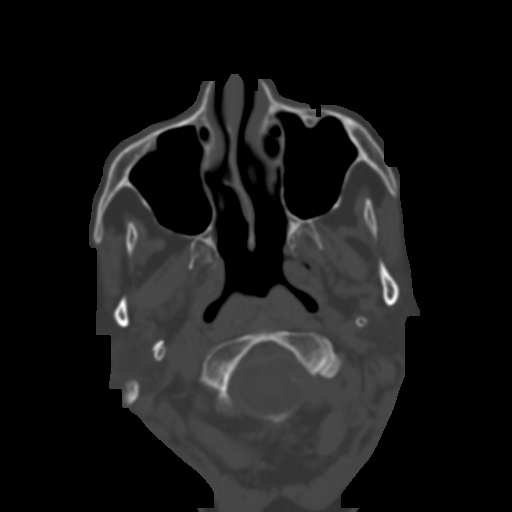
[im 52/75  bone]
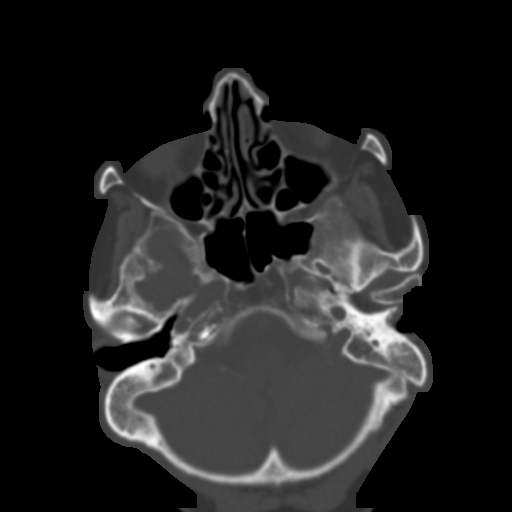
[im 57/75  brain]
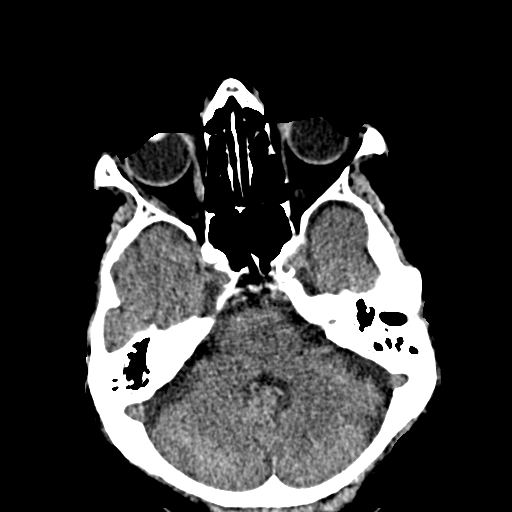
[im 57/75  bone]
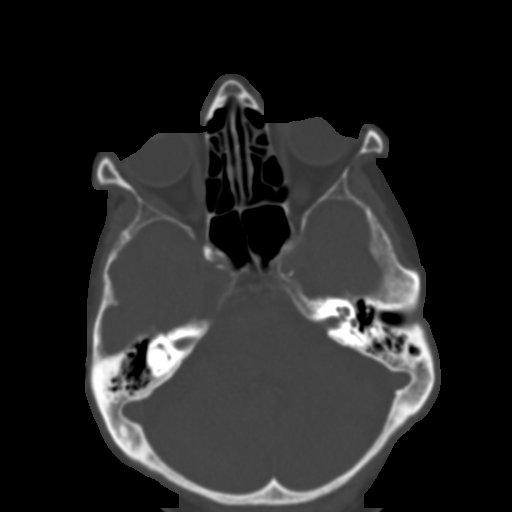
[im 64/75  bone]
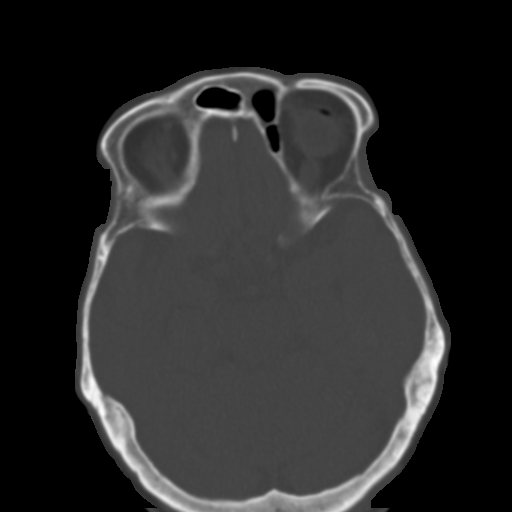
[im 69/75  bone]
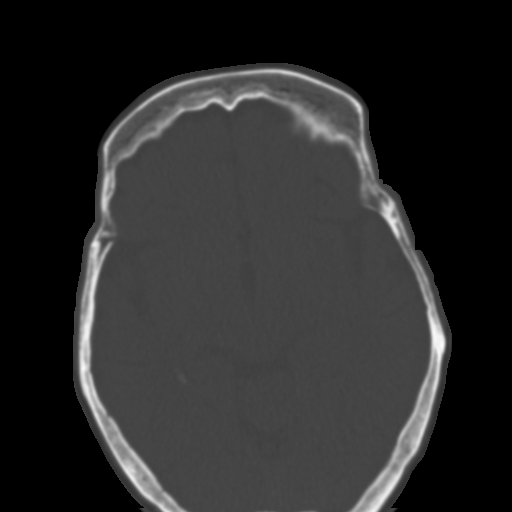

[Series 8040: st cor · coronal · 0.37mm/px · 3 of 66 slices shown]
[im 22/66  bone]
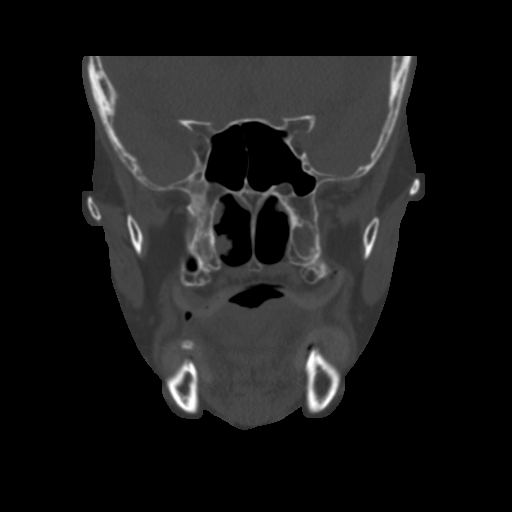
[im 29/66  bone]
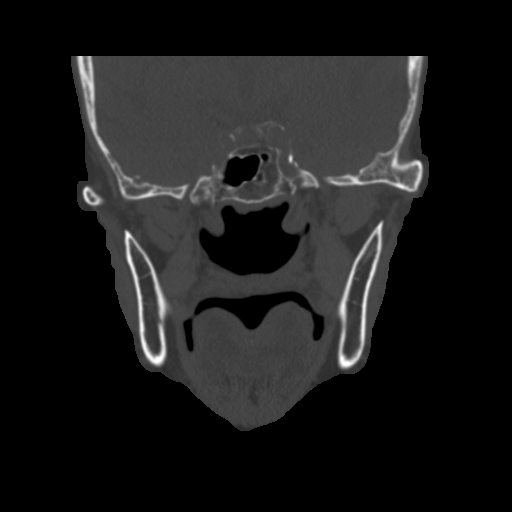
[im 37/66  bone]
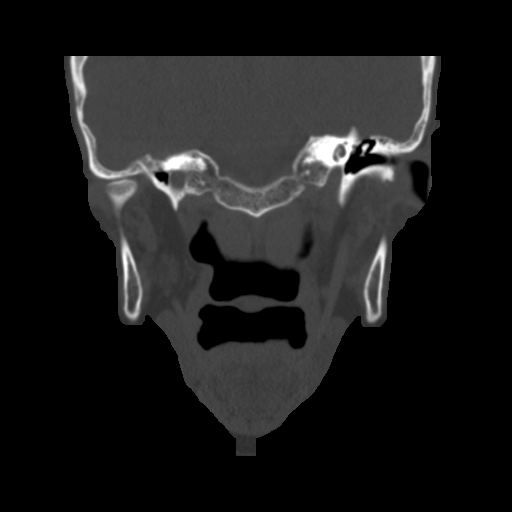

[Series 8041: st sag · sagittal · 0.37mm/px · 3 of 68 slices shown]
[im 23/68  bone]
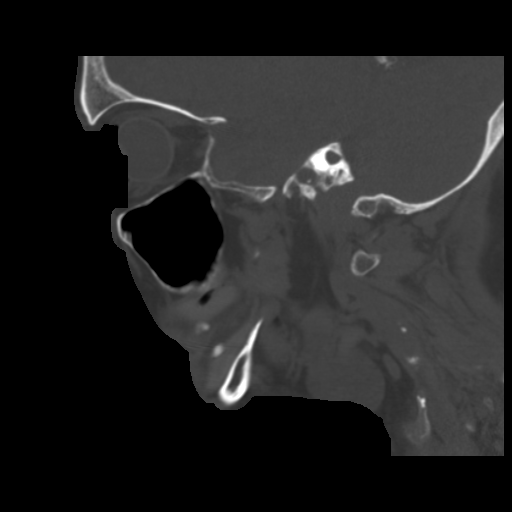
[im 34/68  bone]
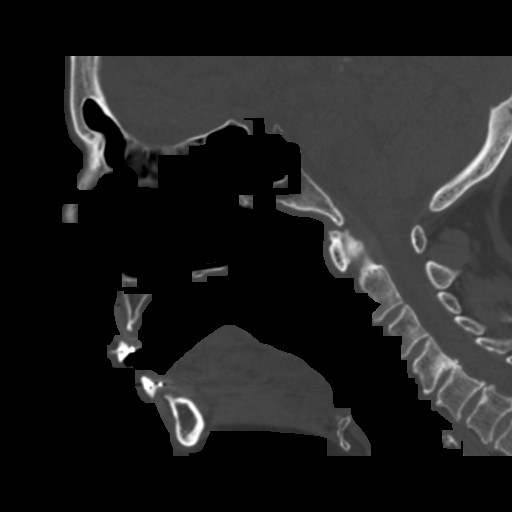
[im 45/68  bone]
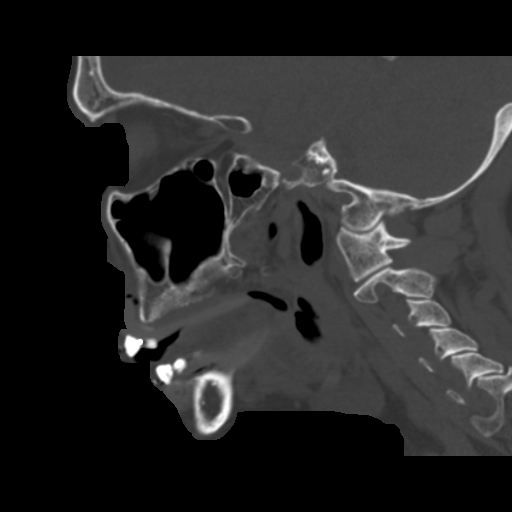

[17 of 47 positions shown; findings below may reference images not displayed]

FINDINGS: Lens replacement in the left eye.  Bilaterally
enophthalamos.  No orbital mass or edema is present.  The globe is
intact.  Extraocular muscles are normal in size.  Optic nerve is
normal in size.

Paranasal sinuses are clear without evidence of sinusitis.  Nasal
septum is deviated to the right.  Visualized brain shows
generalized atrophy without focal abnormality. Degenerative change
in the TMJ bilaterally.
IMPRESSION: Bilateral enophthalamos.  No orbital mass or edema is present.  No
acute abnormality.

## 2011-12-22 ENCOUNTER — Other Ambulatory Visit: Payer: Self-pay | Admitting: Family Medicine

## 2011-12-22 DIAGNOSIS — G252 Other specified forms of tremor: Secondary | ICD-10-CM

## 2011-12-22 MED ORDER — PRIMIDONE 50 MG PO TABS: 50.0000 mg | ORAL_TABLET | Freq: Every day | ORAL | Status: DC

## 2011-12-22 MED ORDER — LISINOPRIL 10 MG PO TABS: 10.0000 mg | ORAL_TABLET | Freq: Every day | ORAL | Status: DC

## 2012-01-06 ENCOUNTER — Encounter: Payer: Self-pay | Admitting: Family Medicine

## 2012-01-06 ENCOUNTER — Ambulatory Visit (INDEPENDENT_AMBULATORY_CARE_PROVIDER_SITE_OTHER): Payer: PRIVATE HEALTH INSURANCE | Admitting: Family Medicine

## 2012-01-06 VITALS — BP 166/88 | HR 72 | Temp 97.5°F | Wt 107.4 lb

## 2012-01-06 DIAGNOSIS — H539 Unspecified visual disturbance: Secondary | ICD-10-CM

## 2012-01-06 DIAGNOSIS — H547 Unspecified visual loss: Secondary | ICD-10-CM

## 2012-01-06 DIAGNOSIS — I10 Essential (primary) hypertension: Secondary | ICD-10-CM

## 2012-01-06 NOTE — Assessment & Plan Note (Signed)
Reported low BP at home despite consistently high readings both systolic and diastolic in office.  Concerned that we are not being as aggressive as we could be due to concerned about home low BP's.  Will refer to ambulatory BP monitoring to get more information so we can get further information to adequately control her blood pressure.

## 2012-01-06 NOTE — Progress Notes (Signed)
  Subjective:    Patient ID: [name], female    DOB: 12-25-1931, 78 y.o.   MRN: [id number]  HPI  Patient here to follow-up on BP while [place].  Hypertension:  Has noted high blood pressures at home but also low blood pressures- was at the pharmacy and was noted to have blood pressure in 120's-130's, felt faint, required assistance from staff.  Was able to get back home ok.  In January this year, was d/c from HCTZ due to low BP;s.  In April, metoprolol was increased  From 100 qam to 100 qam, 50 qpm by geri clinic.  BP Readings from Last 3 Encounters:  01/06/12 166/88  12/16/11 161/73  12/05/11 167/82   Patient concerned about vision. States her ophthalmologist  Sent her to a retinal specialist.  I have reviewed patient's  PMH, FH, and Social history and Medications as related to this visit. PMH sig for CAD    Review of Systems See HPI    Objective:   Physical Exam GEN: Alert & Oriented, No acute distress, frail.   CV:  Regular Rate & Rhythm, no murmur Respiratory:  Normal work of breathing, CTAB Ext: no pre-tibial edema        Assessment & Plan:

## 2012-01-06 NOTE — Assessment & Plan Note (Signed)
?   Retinal problem?m  Advised her to have report of upcoming appt with vision specialist sent to our office and to make follow-up with PCP to review and to address risk management/chronic health conditions.

## 2012-01-06 NOTE — Patient Instructions (Signed)
Make appointment with Dr. Raymondo Band for blood pressure monitoring.  Make appointment with Dr. Gwendolyn Grant for follow-up and fasting bloodwork.

## 2012-01-09 ENCOUNTER — Ambulatory Visit (INDEPENDENT_AMBULATORY_CARE_PROVIDER_SITE_OTHER): Payer: PRIVATE HEALTH INSURANCE | Admitting: Pharmacist

## 2012-01-09 VITALS — BP 142/82 | Wt 106.7 lb

## 2012-01-09 DIAGNOSIS — I10 Essential (primary) hypertension: Secondary | ICD-10-CM

## 2012-01-09 NOTE — Progress Notes (Signed)
Subjective:    Patient ID: [name], female    DOB: 1932/01/23, 78 y.o.   MRN: [id number]  HPI  [name] is a 80yoF that presented to the clinic for 24-hour ambulatory blood pressure monitoring after referral by Dr. Earnest Bailey (PCP is Dr. Gwendolyn Grant).  Patient reports being on antihypertensives for a long time. Has had episodes of hypertension (180's/80's) as well as episodes of hypotension. Was recently at her local pharmacy where she developed weakness (denied dizziness) and was found to be hypotensive. Was given some soda and was escorted home without further issues. She reports that day was extremely warm and that she spent much of the day shopping so may have been associated with some dehydration.  When she has episodes of hypertension she endorses feelings of anxiety which prompt her to take her blood pressure. Discussed procedure for wearing the monitor and gave patient written and verbal instructions. Monitor was placed on non-dominant arm with instructions to return in the morning. Patient returned to the clinic and reported some occasionally being startled when cuff inflated, but no significant issues.   Patient reports that Dr. Allyne Gee (retina specialist) noted vision loss (macular degeneration and other eye issue) that will lead to eventual blindness--will start ophthalmic injections to slow down progression of blindness. Patient is not allowed to drive anymore and has sold her car. Dr. Allyne Gee also recommended that she get a live-in-caretaker, which would likely be her daughter. They are discussing a possible move to [place] where family lives.  Patient has a history of osteoporosis, but reported not taking her calcium supplementation. Discussed the importance of Calcium + D in the setting of osteoporosis and Boniva therapy.  Current BP Medications Lisinopril 10mg  daily in the AM Metoprolol succinate 100mg  daily in the AM, 50mg  in the PM  Other antihypertensives tried in the  past: In January this year, HCTZ dc'd due to low BP  No Known Allergies  Review of Systems    Objective:   Physical Exam Last 3 Office BP readings: 01/06/12: 166/88 mmHg 12/16/11: 161/73 mmHg 12/05/11: 167/82 mmHg  Today's Office BP reading: 142/82 mmHg (manual)  ABPM Study Data: Arm Placement left arm   Overall Mean 24hr BP:   127/65 mmHg  HR: 66  Daytime Mean BP:  130/67 mmHg  HR: 67  Nighttime Mean BP:  118/57 mmHg  HR: 62  Dipping Pattern: Systolic:  Yes, Systolic -9.3%, Dia -15.2% [normal dipping ~10-20%]   Non-hypertensive ABPM thresholds: daytime BP <135/85 mmHg, sleeptime BP <120/75 mmHg NICE Hypertension Guidelines ([place]) using ABPM: Stage I: >135/85 mmHg, Stage 2: >150/95 mmHg)   BMET    Component Value Date/Time   NA 139 11/28/2011 0952   K 3.9 11/28/2011 0952   CL 105 11/28/2011 0952   CO2 23 11/28/2011 0952   GLUCOSE 100* 11/28/2011 0952   BUN 19 11/28/2011 0952   CREATININE 1.16* 11/28/2011 0952   CREATININE 0.85 12/27/2010 0650   CALCIUM 9.7 11/28/2011 0952   GFRNONAA >60 12/27/2010 0650   GFRAA  Value: >60        The eGFR has been calculated using the MDRD equation. This calculation has not been validated in all clinical situations. eGFR's persistently <60 mL/min signify possible Chronic Kidney Disease. 12/27/2010 0650      Assessment & Plan:   80yoF with hypertension that appears to be well controlled based on a 24-hr average of 127/49mmHg with normal dipping pattern. During the 24-hr study, pt had some episodes of isolated systolic  hypertension, that appear to coincide with cigarette smoking, but patient is not interested in quitting at this time (smoking ~5 cigarettes/day). Did not make any changes to medications today, but counseled patient to contact MD if she experiences any dizziness, weakness, or any other issues. May consider decreasing dose of lisinopril if she experiences any hypotension or orthostasis. Patient seen with Melynda Ripple, PharmD. Candidate and  Benjaman Pott, PharmD, Pharmacy Resident.

## 2012-01-10 ENCOUNTER — Encounter: Payer: Self-pay | Admitting: Pharmacist

## 2012-01-10 NOTE — Assessment & Plan Note (Signed)
80yoF with hypertension that appears to be well controlled based on a 24-hr average of 127/14mmHg with normal dipping pattern. During the 24-hr study, pt had some episodes of isolated systolic hypertension, that appear to coincide with cigarette smoking, but patient is not interested in quitting at this time (smoking ~5 cigarettes/day). Did not make any changes to medications today, but counseled patient to contact MD if she experiences any dizziness, weakness, or any other issues. May consider decreasing dose of lisinopril if she experiences any hypotension or orthostasis. Patient seen with Melynda Ripple, PharmD. Candidate and Benjaman Pott, PharmD, Pharmacy Resident.

## 2012-01-10 NOTE — Progress Notes (Signed)
Patient ID: [name], female   DOB: June 10, 1932, 78 y.o.   MRN: [id number] Reviewed and agree with Dr. Macky Lower management.

## 2012-01-10 NOTE — Patient Instructions (Addendum)
It was good to meet you!  Your overall 24-hr BP average 127/65 mmHg. We will share your results with your doctor.  We will not make any changes to your medications today.   If you experience any dizziness, weakness, or any other issues, please contact your doctor.

## 2012-01-14 NOTE — Progress Notes (Signed)
Patient ID: [name], female   DOB: 09/08/1931, 78 y.o.   MRN: [id number] Reviewed and agree with Dr. Koval's management. 

## 2012-01-28 ENCOUNTER — Other Ambulatory Visit: Payer: Self-pay | Admitting: *Deleted

## 2012-01-29 MED ORDER — METOPROLOL SUCCINATE ER 100 MG PO TB24: 100.0000 mg | ORAL_TABLET | Freq: Every day | ORAL | Status: DC

## 2012-02-04 ENCOUNTER — Encounter: Payer: Self-pay | Admitting: Family Medicine

## 2012-02-04 ENCOUNTER — Ambulatory Visit (INDEPENDENT_AMBULATORY_CARE_PROVIDER_SITE_OTHER): Payer: PRIVATE HEALTH INSURANCE | Admitting: Family Medicine

## 2012-02-04 VITALS — BP 159/79 | HR 82 | Temp 98.6°F | Ht 64.0 in | Wt 105.0 lb

## 2012-02-04 DIAGNOSIS — I10 Essential (primary) hypertension: Secondary | ICD-10-CM

## 2012-02-04 DIAGNOSIS — F329 Major depressive disorder, single episode, unspecified: Secondary | ICD-10-CM

## 2012-02-04 DIAGNOSIS — H539 Unspecified visual disturbance: Secondary | ICD-10-CM

## 2012-02-04 DIAGNOSIS — H3530 Unspecified macular degeneration: Secondary | ICD-10-CM

## 2012-02-04 DIAGNOSIS — G252 Other specified forms of tremor: Secondary | ICD-10-CM

## 2012-02-04 DIAGNOSIS — G250 Essential tremor: Secondary | ICD-10-CM

## 2012-02-04 NOTE — Progress Notes (Signed)
Patient ID: [name], female   DOB: [birth year], 78 y.o.   MRN: [id number] [name] is a 78 y.o. female who presents to [facility] today for follow-up:  1.  Going blind in Left eye ("good eye"):  Patient has had long-term blindness except for Light in her Right eye secondary to cataract that cannot be removed.  She has had increasing difficulty in vision in Left eye for past several months, describing "shadows" and scotomas.  Told by her retinal specialist she has macular degeneration and is going blind in Left eye.  Patient obviously upset about this.  States wanting to see her out of state children one last time is main goal currently.    2.   Hypertension:  Long-term problem for this patient.  No adverse effects from medication.  Not checking it regularly.  No HA, CP, dizziness, shortness of breath, palpitations, or LE swelling.   BP Readings from Last 3 Encounters:  02/04/12 159/79  01/09/12 142/82  01/06/12 166/88    3.  Tremor/Toprol use:  Wants to know if lightheadedness is side effect of Toprol.  Tremor is somewhat improved with Toprol use.  Describes lightheadedness when lying supine at night for about 30 - 60 seconds, also feels when arises in AM.  Feels this everyday, knows it's coming, sits on side of bed to equilibrate first to make sure she doesn't fall.  Denies these symptoms when standing any other time during day.  No falls.     The following portions of the patient's history were reviewed and updated as appropriate: allergies, current medications, past medical history, family and social history, and problem list.  Patient is a nonsmoker.   ROS as above otherwise neg. No Chest pain, palpitations, SOB, Fever, Chills, Abd pain, N/V/D.  Medications reviewed. Current Outpatient Prescriptions  Medication Sig Dispense Refill  . acetaminophen (TYLENOL) 500 MG tablet Take 500 mg by mouth every 6 (six) hours as needed.      Marland Kitchen aspirin 81 MG EC tablet Take 81 mg by mouth daily.         . calcium carbonate (TUMS - DOSED IN MG ELEMENTAL CALCIUM) 500 MG chewable tablet Chew 4 tablets by mouth daily.      . Cholecalciferol (D-1000) 1000 UNIT tablet Take 1,000 Units by mouth daily.        Marland Kitchen ibandronate (BONIVA) 3 MG/3ML injection Inject 3 mg into the vein every 3 (three) months.        Marland Kitchen lisinopril (PRINIVIL,ZESTRIL) 10 MG tablet Take 1 tablet (10 mg total) by mouth daily.  30 tablet  1  . metoprolol succinate (TOPROL-XL) 100 MG 24 hr tablet Take 1 tablet (100 mg total) by mouth daily. (150mg  daily total)  30 tablet  3  . metoprolol succinate (TOPROL-XL) 50 MG 24 hr tablet Take 50 mg by mouth daily. Take with or immediately following a meal. (150mg  daily total)      . mirtazapine (REMERON) 15 MG tablet Take 15 mg by mouth at bedtime.      . primidone (MYSOLINE) 50 MG tablet Take 1 tablet (50 mg total) by mouth at bedtime.  30 tablet  1  . simvastatin (ZOCOR) 40 MG tablet Take 20 mg by mouth daily.          Exam:  BP 159/79  Pulse 82  Temp 98.6 F (37 C) (Oral)  Ht 5\' 4"  (1.626 m)  Wt 105 lb (47.628 kg)  BMI 18.02 kg/m2 Gen: Well NAD HEENT:  EOMI,  MMM Lungs: CTABL Nl WOB Heart: RRR, Grade II/VI SEM murmur noted Abd: NABS, NT, ND Exts: Non edematous BL  LE, warm and well perfused.  Neuro:  Slight intention tremor noted, R > L.   No results found for this or any previous visit (from the past 72 hour(s)).

## 2012-02-04 NOTE — Patient Instructions (Addendum)
Keep in contact with both Nelva Bush and me.  No changes to the medications today.   Come back and see me in about 1 month.

## 2012-02-05 ENCOUNTER — Telehealth: Payer: Self-pay | Admitting: Clinical

## 2012-02-05 DIAGNOSIS — H3530 Unspecified macular degeneration: Secondary | ICD-10-CM | POA: Insufficient documentation

## 2012-02-05 DIAGNOSIS — F329 Major depressive disorder, single episode, unspecified: Secondary | ICD-10-CM | POA: Insufficient documentation

## 2012-02-05 NOTE — Assessment & Plan Note (Signed)
Tolerating slightly increased dose of Toprol. Still with tremor but has decreased. No further medication changes.

## 2012-02-05 NOTE — Assessment & Plan Note (Signed)
No changes to meds at this time Permissive hypertension, especially in light of readings from last ambulatory BP monitoring with home systolics in 120s at times.  Definitely want to prevent falls in this elderly osteoporotic female with failing vision.

## 2012-02-05 NOTE — Telephone Encounter (Signed)
Clinical Child psychotherapist (CSW) met with pt and pt daughter Arline Asp to provide emotional support and resources pertaining to pt losing her vision. CSW explored pt feelings, concerns and thoughts regarding her vision loss. Pt was very receptive and began to cry as she stated she was not scared of becoming blind but was sad that she may not be able to see her 2 sons who live in [place] and [place] before becoming blind. CSW provided empathy and validated pt feelings. CSW encouraged pt daughter and pt to explore options for pt see her children in the near future. CSW also praised pt for opening up and informed her that during this process her feelings,thoughts, concerns are important, and okay to discuss. CSW also provided pt with resources for Services of the Blind which assists individuals in keeping their independence, provides educations and several other services for pt who are blind/becomming blind. CSW also provided pt with a list of private duty and agencies and worked with pt PCP to complete a [employer]. Pt and daughter very appreciative of resources and emotional support. CSW will see pt on March 09, 2012 at 10am after pt appt with her PCP.  Theresia Bough, MSW, Theresia Majors [phone]

## 2012-02-05 NOTE — Assessment & Plan Note (Signed)
Acutely worsening, followed by retinal specialist. Obviously very concerning for patient.  Contributing to depression, see above.   Discussed with social worker Theresia Bough, who spent about 15 minutes with patient in counseling as well as options for caregiver/home aid and services for the blind.

## 2012-02-05 NOTE — Assessment & Plan Note (Signed)
No medications desired at this time. Working through multiple emotions of losing independence (having to move in with daughter), losing eyesight, disconnection from rest of her children.  Seen with Theresia Bough CSW who provided patient with multiple options for possible caregiver as well as services for the blind.  Also offered counseling.

## 2012-02-18 ENCOUNTER — Other Ambulatory Visit: Payer: Self-pay | Admitting: *Deleted

## 2012-02-19 MED ORDER — PRIMIDONE 50 MG PO TABS: 50.0000 mg | ORAL_TABLET | Freq: Every day | ORAL | Status: DC

## 2012-02-19 MED ORDER — LISINOPRIL 10 MG PO TABS: 10.0000 mg | ORAL_TABLET | Freq: Every day | ORAL | Status: DC

## 2012-03-04 ENCOUNTER — Other Ambulatory Visit: Payer: Self-pay | Admitting: *Deleted

## 2012-03-04 MED ORDER — MIRTAZAPINE 15 MG PO TABS: 15.0000 mg | ORAL_TABLET | Freq: Every day | ORAL | Status: DC

## 2012-03-09 ENCOUNTER — Ambulatory Visit (INDEPENDENT_AMBULATORY_CARE_PROVIDER_SITE_OTHER): Payer: PRIVATE HEALTH INSURANCE | Admitting: Family Medicine

## 2012-03-09 ENCOUNTER — Encounter: Payer: Self-pay | Admitting: Family Medicine

## 2012-03-09 ENCOUNTER — Encounter: Payer: Self-pay | Admitting: Clinical

## 2012-03-09 VITALS — BP 118/65 | HR 80 | Temp 97.6°F | Wt 105.6 lb

## 2012-03-09 DIAGNOSIS — I10 Essential (primary) hypertension: Secondary | ICD-10-CM

## 2012-03-09 DIAGNOSIS — F17200 Nicotine dependence, unspecified, uncomplicated: Secondary | ICD-10-CM

## 2012-03-09 DIAGNOSIS — F329 Major depressive disorder, single episode, unspecified: Secondary | ICD-10-CM

## 2012-03-09 DIAGNOSIS — F3289 Other specified depressive episodes: Secondary | ICD-10-CM

## 2012-03-09 DIAGNOSIS — H539 Unspecified visual disturbance: Secondary | ICD-10-CM

## 2012-03-09 MED ORDER — SIMVASTATIN 40 MG PO TABS: 40.0000 mg | ORAL_TABLET | Freq: Every day | ORAL | Status: DC

## 2012-03-09 NOTE — Progress Notes (Signed)
  Subjective:    Patient ID: [name], female    DOB: [birth year], 78 y.o.   MRN: [id number]  HPI  Hypertension:  Long-term problem for this patient - current goal is preventing hypotension.  No adverse effects from medication.  Not checking it regularly.  No HA, CP, dizziness, shortness of breath, palpitations, or LE swelling.   BP Readings from Last 3 Encounters:  03/09/12 118/65  02/04/12 159/79  01/09/12 142/82    Macular degeneration and central retinal vein occlusion:  Patient still upset about this diagnosis.  Is working with Clinical Social work towards obtaining help with this, especially regarding seeing her children.  Denies depression currently, no signs or symptoms.  No changes in vision since last visit.  Following up with her ophthalmologist.      Review of Systems See HPI above for review of systems.       Objective:   Physical Exam Eyes:  Amblyopia noted.  Left cataract noted.  EOMI intact.  No cataract noted Right eye.   Cardiac:  Regular rate and rhythm without murmur auscultated.  Good S1/S2. Pulm:  Clear to auscultation bilaterally with good air movement.  No wheezes or rales noted.   Neuro:  Head tremor noted.  No intention tremor on today's exam.        Assessment & Plan:

## 2012-03-09 NOTE — Assessment & Plan Note (Signed)
Counseled to quit completely 

## 2012-03-09 NOTE — Assessment & Plan Note (Signed)
Resistant to medications today.   Following with [name] social worker.   Resistant to any other counseling.

## 2012-03-09 NOTE — Progress Notes (Signed)
Clinical Child psychotherapist (CSW) met with pt and daughter to provide additional resources. CSW explored how pt has been doing/feeling since last seeing CSW. Pt stated she has been doing fine however her vision continues to decline. CSW asked whether pt has been able to see her sons in [place] and [place]. Pt stated she has not been able to reach her son in [place] but has spoken to her son in [place] and unable to see either. See provided active listening. Pt stated she has spoken to her friend from her old apartment however has not been able to see her. CSW encouraged pt and daughter to get involved with senior resources where pt can become more active verses staying home daily however pt stated she prefers to be home. CSW proceeded to still provide pt with resources to [employer] in [facility] as well as the number to disability services for the blind. CSW also provided daughter with contact information to DSS and application for Medicaid for pt daughter to pursue. Pt and daughter appreciative of resources and stated they were headed to [employer] to see where they are in the process to receiving a housing voucher. CSW provided daughter with CSW business card in the instance that [employer] wanted to reach out to CSW. Pt and daughter have no concerns and will follow up with CSW if any arise in the future. No future appointments have been scheduled.  Barbara Bough, MSW, [name] [phone]

## 2012-03-09 NOTE — Assessment & Plan Note (Signed)
Main complaint for this is wanting to see her children. She is following with retinal specialist.   Vision unchanged today.

## 2012-03-09 NOTE — Assessment & Plan Note (Signed)
Permissive hypertension. At goal today.

## 2012-03-10 ENCOUNTER — Other Ambulatory Visit: Payer: Self-pay | Admitting: Family Medicine

## 2012-03-10 MED ORDER — MIRTAZAPINE 15 MG PO TABS: 15.0000 mg | ORAL_TABLET | Freq: Every day | ORAL | Status: DC

## 2012-04-21 ENCOUNTER — Telehealth: Payer: Self-pay | Admitting: Family Medicine

## 2012-04-21 ENCOUNTER — Other Ambulatory Visit: Payer: Self-pay | Admitting: *Deleted

## 2012-04-21 NOTE — Telephone Encounter (Signed)
The refill request for Primidone and Lisinopril have been requested electronically, but the patient would like Dr. Louanne Belton to know that she is completely out.

## 2012-04-21 NOTE — Telephone Encounter (Signed)
Pt sent refill request yesterday to pharm - she is completely out of her Primidone and wants to know what she should do.

## 2012-04-22 MED ORDER — PRIMIDONE 50 MG PO TABS: 50.0000 mg | ORAL_TABLET | Freq: Every day | ORAL | Status: DC

## 2012-04-22 MED ORDER — LISINOPRIL 10 MG PO TABS: 10.0000 mg | ORAL_TABLET | Freq: Every day | ORAL | Status: DC

## 2012-05-22 ENCOUNTER — Ambulatory Visit
Admission: RE | Admit: 2012-05-22 | Discharge: 2012-05-22 | Disposition: A | Discharge status: Home / Self Care | Payer: PRIVATE HEALTH INSURANCE | Source: Ambulatory Visit | Attending: Family Medicine | Admitting: Family Medicine

## 2012-05-22 ENCOUNTER — Encounter: Payer: Self-pay | Admitting: Family Medicine

## 2012-05-22 ENCOUNTER — Ambulatory Visit (INDEPENDENT_AMBULATORY_CARE_PROVIDER_SITE_OTHER): Payer: PRIVATE HEALTH INSURANCE | Admitting: Family Medicine

## 2012-05-22 VITALS — BP 134/57 | HR 70 | Temp 98.2°F | Wt 105.0 lb

## 2012-05-22 DIAGNOSIS — M549 Dorsalgia, unspecified: Secondary | ICD-10-CM

## 2012-05-22 DIAGNOSIS — Z23 Encounter for immunization: Secondary | ICD-10-CM

## 2012-05-22 NOTE — Progress Notes (Signed)
Patient ID: [name], female   DOB: Jun 25, 1932, 78 y.o.   MRN: [id number] Subjective: The patient is a 78 y.o. year old female who presents today for back pain.  Pain has been present for several weeks and is located in her upper right back.  She can point to it's primary location.  It does not have a large amount of radiation (is essentially localized to one section of back) and does not have any aggrevating/alleviating factors.  There was no distinct trauma that precipitated it.  Patient has known history of OA and DJD.  Patient's past medical, social, and family history were reviewed and updated as appropriate. History  Substance Use Topics  . Smoking status: Current Every Day Smoker -- 0.3 packs/day    Types: Cigarettes  . Smokeless tobacco: Not on file  . Alcohol Use: Not on file   Objective:  Filed Vitals:   05/22/12 1059  BP: 134/57  Pulse: 70  Temp: 98.2 F (36.8 C)   Gen: NAD Back: No bony deformities. Slight tenderness to palpation along ribs in the upper right back.  No crepitus.  Normal ROM.  Assessment/Plan: Thoracic back pain of uncertain etiology, favor MSK.  Given history of osteopenia and pain that could be originating from fracture will obtain xray of spine.  If negative would plan symptomatic treatment only.  If positive, would consider intranasal calcitonin.  Please also see individual problems in problem list for problem-specific plans.

## 2012-05-22 NOTE — Patient Instructions (Signed)
Go get an x-ray of your back.  We will be in touch with you about whether we need to start any medications for your pain.

## 2012-05-29 ENCOUNTER — Encounter: Payer: Self-pay | Admitting: Family Medicine

## 2012-06-15 ENCOUNTER — Ambulatory Visit (INDEPENDENT_AMBULATORY_CARE_PROVIDER_SITE_OTHER): Payer: PRIVATE HEALTH INSURANCE | Admitting: Family Medicine

## 2012-06-15 ENCOUNTER — Encounter: Payer: Self-pay | Admitting: Family Medicine

## 2012-06-15 VITALS — BP 167/77 | HR 65 | Temp 97.8°F | Ht 64.0 in | Wt 106.4 lb

## 2012-06-15 DIAGNOSIS — Z111 Encounter for screening for respiratory tuberculosis: Secondary | ICD-10-CM

## 2012-06-15 DIAGNOSIS — J8410 Pulmonary fibrosis, unspecified: Secondary | ICD-10-CM

## 2012-06-15 DIAGNOSIS — I10 Essential (primary) hypertension: Secondary | ICD-10-CM

## 2012-06-15 DIAGNOSIS — M549 Dorsalgia, unspecified: Secondary | ICD-10-CM

## 2012-06-15 DIAGNOSIS — J984 Other disorders of lung: Secondary | ICD-10-CM

## 2012-06-15 DIAGNOSIS — M810 Age-related osteoporosis without current pathological fracture: Secondary | ICD-10-CM

## 2012-06-15 DIAGNOSIS — Z23 Encounter for immunization: Secondary | ICD-10-CM

## 2012-06-15 MED ORDER — IBANDRONATE SODIUM 3 MG/3ML IV SOLN: 3.0000 mg | Freq: Once | INTRAVENOUS | Status: DC

## 2012-06-15 NOTE — Patient Instructions (Signed)
It was good to see you today! We placed a PPD today.  Come back Wednesday to have it read.  We will do your Boniva injection then as well. We will get some labs checking your vitamin D level.

## 2012-06-16 ENCOUNTER — Encounter: Payer: Self-pay | Admitting: Family Medicine

## 2012-06-16 LAB — BASIC METABOLIC PANEL
BUN: 15 mg/dL (ref 6–23)
CO2: 24 mEq/L (ref 19–32)
Calcium: 8.9 mg/dL (ref 8.4–10.5)
Glucose, Bld: 83 mg/dL (ref 70–99)
Potassium: 4.2 mEq/L (ref 3.5–5.3)

## 2012-06-17 ENCOUNTER — Ambulatory Visit (INDEPENDENT_AMBULATORY_CARE_PROVIDER_SITE_OTHER): Payer: PRIVATE HEALTH INSURANCE | Admitting: *Deleted

## 2012-06-17 DIAGNOSIS — M810 Age-related osteoporosis without current pathological fracture: Secondary | ICD-10-CM

## 2012-06-17 MED ORDER — IBANDRONATE SODIUM 3 MG/3ML IV SOLN
3.0000 mg | Freq: Once | INTRAVENOUS | Status: AC
  Administered 2012-06-17: 3 mg via INTRAVENOUS

## 2012-06-17 NOTE — Progress Notes (Signed)
Paged Dr.Ritch to make sure labs are satisfactory for patient to receive Boniva today  and he advises OK to give.    Boniva given .Marland Kitchen Patient to return in 3 months for next dose.

## 2012-06-22 NOTE — Progress Notes (Signed)
Patient ID: [name], female   DOB: [birth year], 78 y.o.   MRN: [id number] Subjective: The patient is a 78 y.o. year old female who presents today for f/u.  1. Back pain: Improving, still present.  Seems aggravated by activity.  X-ray reviewed and shows no evidence of vertebral fracture.  Still present on right side of back in the region of the angle of scapula and lateral.  2. CXR findings: findings showing evidence of old granulomatous disease.  Pt has no recorded PPD.  Has worked around Psychologist, educational.  No cough, night sweats, n/v/d.  3. Osteoporosis: had been on Boniva some time ago.  This was stopped, initially for concern about rise in Cr.  Cr now stablilized and has continued concerns about osteoporosis treatment.  Is taking calcium and Vit D.  Has not had Vit D level checked.  Patient's past medical, social, and family history were reviewed and updated as appropriate. History  Substance Use Topics  . Smoking status: Current Every Day Smoker -- 0.3 packs/day    Types: Cigarettes  . Smokeless tobacco: Not on file  . Alcohol Use: Not on file   Objective:  Filed Vitals:   06/15/12 1150  BP: 167/77  Pulse: 65  Temp: 97.8 F (36.6 C)   Gen: NAD, thin CV: RRR Back: No pain elicited on palpation, no bony abnormalities, full ROM of shoulder and scapula Resp: CTABL Ext: No edema  Assessment/Plan: Plan PPD for granulomatous disease.  This most likely represents exposure to histoplasmosis or other fungal infection but, since patient has never had PPD, feel this should be done.  If negative, no further treatment needed.  Please also see individual problems in problem list for problem-specific plans.

## 2012-06-22 NOTE — Assessment & Plan Note (Signed)
Appears improved.  No obvious cause beyond MSK.  Will plan to recommend supportive care plus analgesia with tylenol (she does not take it regularly and the pain has not been bad enough for her to use it regularly.)

## 2012-06-22 NOTE — Assessment & Plan Note (Signed)
Plan to restart Boniva 3mg  IV Q3 months and closely monitor Cr.  Check Vit D today to ensure supplementation is adequate.

## 2012-07-29 ENCOUNTER — Other Ambulatory Visit: Payer: Self-pay | Admitting: Family Medicine

## 2012-08-14 ENCOUNTER — Other Ambulatory Visit: Payer: Self-pay | Admitting: Family Medicine

## 2012-08-15 ENCOUNTER — Other Ambulatory Visit: Payer: Self-pay | Admitting: Family Medicine

## 2012-08-31 ENCOUNTER — Other Ambulatory Visit: Payer: Self-pay | Admitting: Family Medicine

## 2012-09-01 ENCOUNTER — Other Ambulatory Visit: Payer: Self-pay | Admitting: Family Medicine

## 2012-09-07 ENCOUNTER — Other Ambulatory Visit: Payer: Self-pay | Admitting: Family Medicine

## 2012-09-15 ENCOUNTER — Other Ambulatory Visit: Payer: Self-pay | Admitting: Family Medicine

## 2012-09-15 ENCOUNTER — Other Ambulatory Visit: Payer: Self-pay | Admitting: *Deleted

## 2012-09-16 ENCOUNTER — Ambulatory Visit: Payer: PRIVATE HEALTH INSURANCE

## 2012-09-16 MED ORDER — LISINOPRIL 10 MG PO TABS: 10.0000 mg | ORAL_TABLET | Freq: Every day | ORAL | Status: DC

## 2012-09-21 ENCOUNTER — Ambulatory Visit: Payer: PRIVATE HEALTH INSURANCE

## 2012-09-21 ENCOUNTER — Other Ambulatory Visit: Payer: PRIVATE HEALTH INSURANCE

## 2012-09-21 DIAGNOSIS — M810 Age-related osteoporosis without current pathological fracture: Secondary | ICD-10-CM

## 2012-09-21 LAB — BASIC METABOLIC PANEL
Calcium: 9 mg/dL (ref 8.4–10.5)
Creat: 1.29 mg/dL — ABNORMAL HIGH (ref 0.50–1.10)
Glucose, Bld: 114 mg/dL — ABNORMAL HIGH (ref 70–99)
Sodium: 137 mEq/L (ref 135–145)

## 2012-09-21 NOTE — Progress Notes (Signed)
BMP DONE TODAY Jumaane Weatherford 

## 2012-09-22 ENCOUNTER — Telehealth: Payer: Self-pay | Admitting: *Deleted

## 2012-09-22 NOTE — Progress Notes (Signed)
Pt OK for Boniva injection

## 2012-09-22 NOTE — Telephone Encounter (Signed)
Contacted patient and appointment scheduled for tomorrow.

## 2012-09-22 NOTE — Telephone Encounter (Signed)
Message copied by Salomon Mast on Tue Sep 22, 2012 11:57 AM ------      Message from: [facility] D      Created: Tue Sep 22, 2012 11:40 AM       She can get it      ----- Message -----         From: Charolette Forward, RN         Sent: 09/22/2012  11:15 AM           To: Brent Bulla, MD            Please review labs from yesterday.  Patient due for Boniva. Please enter note that states whether it is OK to administer.  Larita Fife

## 2012-09-23 ENCOUNTER — Ambulatory Visit: Payer: PRIVATE HEALTH INSURANCE

## 2012-09-24 ENCOUNTER — Ambulatory Visit (INDEPENDENT_AMBULATORY_CARE_PROVIDER_SITE_OTHER): Payer: PRIVATE HEALTH INSURANCE | Admitting: *Deleted

## 2012-09-24 DIAGNOSIS — M810 Age-related osteoporosis without current pathological fracture: Secondary | ICD-10-CM

## 2012-09-24 MED ORDER — IBANDRONATE SODIUM 3 MG/3ML IV SOLN
3.0000 mg | Freq: Once | INTRAVENOUS | Status: AC
  Administered 2012-09-24: 3 mg via INTRAVENOUS

## 2012-09-24 NOTE — Progress Notes (Signed)
Patient in today for Boniva.   Boniva given IV without any complications. . Advised patient to plan on seeing Dr.Ritch prior to next [facility] and determine when labs need to be checked prior to next Boniva injection.  Patient needs labs every 3 months and see MD every 6 months. Daughter states she will need refill on meds soon and will  call for appointment.  IV butterfly needle inserted into anticubical space on right  arm by Altamese Dilling RN,  Boniva slowly pushed by Myrlene Broker RN. Patient had no complications.

## 2012-10-16 ENCOUNTER — Other Ambulatory Visit: Payer: Self-pay | Admitting: Family Medicine

## 2012-10-19 ENCOUNTER — Ambulatory Visit (INDEPENDENT_AMBULATORY_CARE_PROVIDER_SITE_OTHER): Payer: PRIVATE HEALTH INSURANCE | Admitting: Family Medicine

## 2012-10-19 ENCOUNTER — Other Ambulatory Visit: Payer: Self-pay | Admitting: Family Medicine

## 2012-10-19 VITALS — BP 125/73 | HR 75 | Temp 98.4°F | Wt 101.5 lb

## 2012-10-19 DIAGNOSIS — R5381 Other malaise: Secondary | ICD-10-CM

## 2012-10-19 LAB — BASIC METABOLIC PANEL
CO2: 23 mEq/L (ref 19–32)
Calcium: 9.8 mg/dL (ref 8.4–10.5)
Creat: 1.14 mg/dL — ABNORMAL HIGH (ref 0.50–1.10)
Sodium: 136 mEq/L (ref 135–145)

## 2012-10-19 LAB — CBC
HCT: 32.6 % — ABNORMAL LOW (ref 36.0–46.0)
Hemoglobin: 10.8 g/dL — ABNORMAL LOW (ref 12.0–15.0)
MCHC: 33.1 g/dL (ref 30.0–36.0)
RBC: 3.82 MIL/uL — ABNORMAL LOW (ref 3.87–5.11)

## 2012-10-19 MED ORDER — LISINOPRIL 2.5 MG PO TABS: 10.0000 mg | ORAL_TABLET | Freq: Every day | ORAL | Status: DC

## 2012-10-19 MED ORDER — MIRTAZAPINE 15 MG PO TABS: 15.0000 mg | ORAL_TABLET | Freq: Every day | ORAL | Status: DC

## 2012-10-19 NOTE — Assessment & Plan Note (Addendum)
For the past 1.5 weeks. Vertigo and orthostatic symptoms with positive orthostatic vital signs.  -Decrease lisinopril from 10 to 2.5. -Continue metoprolol 150 mg daily. -Increase fluid intake.  -Check renal function and for anemia. She may benefit from IVF. We will see where Cr is.  -Encouraged her getting walker. She is a high fall risk due to her chronic debilitation, significant visual deficits  -Refer to [facility] to see if home health would be appropriate for her. I agree with needs constant supervision and daughter is currently unable to work due to watching her mother, so she may benefit from home health aide  -Follow-up in 1 week or sooner if needed with worsening symptoms/other concerns  -Daughter mentioned possibility of stroke. Although possible, less likely at this time than other diagnoses without focal deficits/slurred speech/etc. We will defer on work-up at this time.

## 2012-10-19 NOTE — Patient Instructions (Addendum)
Decrease lisinopril from 10 mg to 2.5 mg (new prescription sent to pharmacy) Please drink more liquids (not beer or wine)  Let's check labs today   Please get the walker  Follow-up in 1 week with Dr. Louanne Belton

## 2012-10-19 NOTE — Progress Notes (Signed)
  Subjective:    Patient ID: [name], female    DOB: Oct 06, 1931, 79 y.o.   MRN: [id number]  HPI # Lightheadedness/dizziness for the past 1.5 weeks She can just be sitting and move her head and make her feel like she is swimming. Sometimes she feels like the room is moving. It also occurs when she gets up in the morning and most of the day after.  In the late afternoon or evening these symptoms improved.   She has pain in the back of her neck She feels weak and her knees does not want to work right   This has happened to her in the past. It usually happened when she woke up in the morning and raised up but when she sat still it would go away.  Denies new activity, environment, medications  Daughter is concerned because she doe not do much during the day. Gets up, eats, and then sits on the couch and watches television.    Patient says she stays on the couch because she has this swimming feeling whenever she gets up   Review of Systems Per HPI She feels her hearing is worse sometimes; she cannot localize which ear; "both I guess" Denies fevers, chills, nausea, headache Denies depression, changes in eating or sleeping (dtr reports she "does those things very well") Denies chest pain or difficulty breathing  Patient and daughter denies changes in speech or cognition   Allergies, medication, past medical history reviewed.  Smoking status noted.  Macular degeneration   Significant cataract right eye, worsening vision in left eye that has been markedly worsening past few months      Objective:   Physical Exam GEN: NAD; thin; chronically ill appearing PSYCH: appropriate to questions HEENT:   Head: [place]/AT   Eyes: cataract right eye; she has distant look; EOMI; no nystagmus; vision grossly intact in left eye, decreased vision in right eye    Mouth: dry MM CV: RRR, decreased heart sounds PULM: NI WOB; CTAB without w/r/r EXT: no edema  NEURO:   No focal deficits, no slurred  speech   Unstable when going from sitting to standing; unable to lift out of chair without using hands   Negative Romberg    GAIT: slow, non-shuffling   No cogwheeling   Mild resting tremors in hands; moves jaws side to side   Strength: 4/5 all extremities    Assessment & Plan:

## 2012-10-20 ENCOUNTER — Telehealth: Payer: Self-pay | Admitting: Family Medicine

## 2012-10-20 MED ORDER — MECLIZINE HCL 25 MG PO TABS: 25.0000 mg | ORAL_TABLET | Freq: Two times a day (BID) | ORAL | Status: DC | PRN

## 2012-10-20 NOTE — Telephone Encounter (Signed)
LVM. Notified normal renal function. Hgb shows normocytic anemia with Hgb10.8. It had been 11.5, 12.0, 10.2, 8.4, 8.3, 9.7 in the past; no obvious source of bleeding. D/Dx: anemia of chronic renal disease (stage 2-3 CKD), iron deficiency, occult bleed, malignancy. Will defer management to PCP.  Anemia may be contributing but also suspect chronic dehydration, Na on the lower side of normal and she appears dehydrated.     Rx for meclizine called in. Cautioned against potential drowsiness/fatigue.  Encouraged hydration.   Follow-up with Dr. Louanne Belton or come in sooner if worsening symptoms or other significant concerns.

## 2012-10-20 NOTE — Assessment & Plan Note (Signed)
Addendum:  LVM regarding lab results. Notified normal renal function. Hgb shows normocytic anemia with Hgb10.8. It had been 11.5, 12.0, 10.2, 8.4, 8.3, 9.7 in the past; no obvious source of bleeding. D/Dx: anemia of chronic renal disease (stage 2-3 CKD), iron deficiency, occult bleed, malignancy. Will defer management to PCP.  Anemia may be contributing but also suspect chronic dehydration, Na on the lower side of normal and she appears dehydrated.     Rx for meclizine called in. Cautioned against potential drowsiness/fatigue.  Encouraged hydration.   Follow-up with Dr. Louanne Belton or come in sooner if worsening symptoms or other significant concerns.

## 2012-10-20 NOTE — Addendum Note (Signed)
Addended by: Madolyn Frieze, Marylene Land J on: 10/20/2012 10:24 AM   Modules accepted: Orders

## 2012-10-28 ENCOUNTER — Encounter: Payer: Self-pay | Admitting: Family Medicine

## 2012-10-28 ENCOUNTER — Ambulatory Visit (INDEPENDENT_AMBULATORY_CARE_PROVIDER_SITE_OTHER): Payer: PRIVATE HEALTH INSURANCE | Admitting: Family Medicine

## 2012-10-28 VITALS — BP 164/77 | HR 74 | Temp 97.8°F | Ht 63.0 in | Wt 101.0 lb

## 2012-10-28 DIAGNOSIS — M542 Cervicalgia: Secondary | ICD-10-CM

## 2012-10-28 DIAGNOSIS — G8929 Other chronic pain: Secondary | ICD-10-CM

## 2012-10-28 DIAGNOSIS — I2510 Atherosclerotic heart disease of native coronary artery without angina pectoris: Secondary | ICD-10-CM

## 2012-10-28 DIAGNOSIS — R292 Abnormal reflex: Secondary | ICD-10-CM

## 2012-10-28 DIAGNOSIS — R42 Dizziness and giddiness: Secondary | ICD-10-CM

## 2012-10-28 DIAGNOSIS — Z9181 History of falling: Secondary | ICD-10-CM

## 2012-10-28 LAB — LIPID PANEL
Cholesterol: 135 mg/dL (ref 0–200)
Total CHOL/HDL Ratio: 3.6 Ratio
Triglycerides: 173 mg/dL — ABNORMAL HIGH (ref <150)
VLDL: 35 mg/dL (ref 0–40)

## 2012-10-28 LAB — TSH: TSH: 1.461 u[IU]/mL (ref 0.350–4.500)

## 2012-10-28 MED ORDER — METOPROLOL SUCCINATE ER 100 MG PO TB24: 100.0000 mg | ORAL_TABLET | Freq: Every day | ORAL | Status: DC

## 2012-10-28 MED ORDER — ASPIRIN 81 MG PO TBEC: 81.0000 mg | DELAYED_RELEASE_TABLET | Freq: Every day | ORAL | Status: DC

## 2012-10-28 MED ORDER — SIMVASTATIN 40 MG PO TABS: 40.0000 mg | ORAL_TABLET | Freq: Every day | ORAL | Status: DC

## 2012-10-28 MED ORDER — LISINOPRIL 2.5 MG PO TABS: 2.5000 mg | ORAL_TABLET | Freq: Every day | ORAL | Status: DC

## 2012-10-28 MED ORDER — CHOLECALCIFEROL 25 MCG (1000 UT) PO TABS: 1000.0000 [IU] | ORAL_TABLET | Freq: Every day | ORAL | Status: AC

## 2012-10-28 MED ORDER — METOPROLOL SUCCINATE ER 50 MG PO TB24: 50.0000 mg | ORAL_TABLET | Freq: Every day | ORAL | Status: DC

## 2012-10-28 MED ORDER — PRIMIDONE 50 MG PO TABS: 50.0000 mg | ORAL_TABLET | Freq: Every day | ORAL | Status: DC

## 2012-10-29 ENCOUNTER — Encounter: Payer: Self-pay | Admitting: Family Medicine

## 2012-10-30 ENCOUNTER — Ambulatory Visit
Admission: RE | Admit: 2012-10-30 | Discharge: 2012-10-30 | Disposition: A | Discharge status: Home / Self Care | Payer: PRIVATE HEALTH INSURANCE | Source: Ambulatory Visit | Attending: Family Medicine | Admitting: Family Medicine

## 2012-10-30 DIAGNOSIS — G8929 Other chronic pain: Secondary | ICD-10-CM

## 2012-11-02 ENCOUNTER — Encounter: Payer: Self-pay | Admitting: Family Medicine

## 2012-11-02 ENCOUNTER — Other Ambulatory Visit: Payer: Self-pay | Admitting: Family Medicine

## 2012-11-02 ENCOUNTER — Telehealth: Payer: Self-pay | Admitting: Family Medicine

## 2012-11-02 DIAGNOSIS — Z9181 History of falling: Secondary | ICD-10-CM | POA: Insufficient documentation

## 2012-11-02 NOTE — Assessment & Plan Note (Signed)
Unclear etiology.  Possibilities include inner ear pathology, neuro-degenerative disease, and occult stroke.  Will refer to geri clinic for further eval and recs on appropriate referral.

## 2012-11-02 NOTE — Telephone Encounter (Signed)
Received call from nurse at home.  Systolic of 98.  Wanting to know if should continue 150mg  toprol xl.  Will decrease to 100mg  daily and refer to pharmacy clinic for 24 hour bp monitoring.

## 2012-11-02 NOTE — Progress Notes (Signed)
Patient ID: [name], female   DOB: 09/25/1931, 79 y.o.   MRN: [id number] Subjective: The patient is a 79 y.o. year old female who presents today for dizziness.  Patient seen about a week ago and was rx meclazine for poss vertigo.  Has not been effective.  Reports dizziness come in spells, can happen when sitting or standing, and is sensation of "swiming" not of room spinning.  This has been going on for some time between months and a year.  Patient has had falls related to this sensation and daughter is afraid that she may break something from one of them.  Patient also complains of intermittent central posterior neck pain.  This pain is primarily present when she tilts her head back and has been present for years.  It is not worsening, is not associated with her dizziness, and does not radiate.  She reports she is quite concerned about it and that "none of my doctors have ever done anything about it."  Patient's past medical, social, and family history were reviewed and updated as appropriate. History  Substance Use Topics  . Smoking status: Current Every Day Smoker -- 0.30 packs/day    Types: Cigarettes  . Smokeless tobacco: Not on file  . Alcohol Use: Not on file   Objective:  Filed Vitals:   10/28/12 1149  BP: 164/77  Pulse: 74  Temp: 97.8 F (36.6 C)   Gen: NAD, mild hand tremor CV: RRR Neuro: Bilateral resting hand tremor, no cogwheeling.  CN 2-12 intact.  No postural instability.  Negative Rhomberg  Assessment/Plan:  Please also see individual problems in problem list for problem-specific plans.

## 2012-11-02 NOTE — Assessment & Plan Note (Signed)
Refer to geri clinic for gait assessment

## 2012-11-02 NOTE — Assessment & Plan Note (Signed)
Neck x-ray per patient request.  Most likely related to DJD/arthritis but will make certain.

## 2012-11-05 ENCOUNTER — Ambulatory Visit (INDEPENDENT_AMBULATORY_CARE_PROVIDER_SITE_OTHER): Payer: PRIVATE HEALTH INSURANCE | Admitting: Family Medicine

## 2012-11-05 ENCOUNTER — Other Ambulatory Visit: Payer: Self-pay | Admitting: Family Medicine

## 2012-11-05 ENCOUNTER — Encounter: Payer: Self-pay | Admitting: Family Medicine

## 2012-11-05 VITALS — BP 122/74 | HR 72 | Ht 63.0 in | Wt 101.0 lb

## 2012-11-05 DIAGNOSIS — I10 Essential (primary) hypertension: Secondary | ICD-10-CM

## 2012-11-05 NOTE — Patient Instructions (Addendum)
It was great to see you today.  For your dizziness, it seems to be related to neck position/sitting or looking up quickly.  - Try a supportive memory foam pillow to protect the curve of your neck. - Also, try to avoid looking up for long periods. - If this does not improve, please let us know and we may want you to follow-up with rehabilitation.  Your hearing is not overly concerning today to be contributing to your dizziness.  - You can try headphones that block out background noise. - If hearing worsens, you can see a hearing doctor for an evaluation.  Please schedule follow-up with your Primary Doctor as needed.

## 2012-11-05 NOTE — Assessment & Plan Note (Signed)
Etiology remains unclear.  Normal orthostatics.  Dix hallpike without nystagmus but gets vertigio symptoms when sitting up quickly.  Reports some improvement in symptoms.  Will continue prn meclizine for now and if does not continue to improve would recommend vestibular rehab.  Discussed fall prevention and given brochure.

## 2012-11-05 NOTE — Assessment & Plan Note (Signed)
Seems to be related to mechanical factors rather than dizziness.  Discussed fall prevention with her and rising slowly and avoiding quick movements that may worsen dizziness symptoms.

## 2012-11-05 NOTE — Progress Notes (Signed)
  Subjective:    Patient ID: [name], female    DOB: [birth year], 79 y.o.   MRN: [id number]  HPI  [name] presents to geriatric clinic today for further evaluation of dizziness and gait.  Reports that she has experienced episodes of dizziness over the pat couple of months.  Describes dizziness as feeling that she is in a swimming pool or the room is spinning.  Her symptoms generally only last a few seconds. She denies any feeling that she may pass out.  Her dizziness is exacerbated by walking or standing too quickly and when she fully extends her neck.  Overall though she feels that her symptoms have improved some. She does have some vision difficulties due to a cataract in the R eye and macular degeneration in the L eye and is followed by Dr. Allyne Gee for this.  She does endorse having one fall over the past year but this has not been related to her dizziness but when she was trying to catch her dog.  She is taking Vitamin D3 1000 IU daily.  She denies nausea, vomiting, chest pain, or palpitations.   Review of Systems Per HPI    Objective:   Physical Exam  Constitutional: She is oriented to person, place, and time.  Thin female, no distress. Head tremor noticeable.   HENT:  Head: Normocephalic and atraumatic.  Eyes: EOM are normal. Pupils are equal, round, and reactive to light.  Some difficulty with visual field testing.   Neck: Neck supple.  Cardiovascular: Normal rate, regular rhythm and normal heart sounds.   No murmur heard. No carotid bruit heard.   Musculoskeletal: She exhibits no edema.  Neurological: She is alert and oriented to person, place, and time. She has normal strength. She displays tremor. No cranial nerve deficit.  Gait appears normal, no widening or shuffling.  No listing to either side.   No difficulty standing from a seated position without pushing off chair with hands, this does elicit some mild dizziness  No dizziness recreated with full extension of  neck.        Orthostatics 127/77 HR 70 Lying 147/84 HR 71 Sitting 122/74 HR 72 Standing    Assessment & Plan:

## 2012-11-11 ENCOUNTER — Encounter: Payer: Self-pay | Admitting: Home Health Services

## 2012-11-11 NOTE — Assessment & Plan Note (Signed)
Contribute to fall risk

## 2012-11-11 NOTE — Assessment & Plan Note (Signed)
well controlled  

## 2012-11-11 NOTE — Assessment & Plan Note (Signed)
Makes her at higher risk for fall related fracture

## 2012-11-12 ENCOUNTER — Other Ambulatory Visit: Payer: Self-pay | Admitting: Family Medicine

## 2012-11-16 ENCOUNTER — Telehealth: Payer: Self-pay | Admitting: *Deleted

## 2012-11-16 NOTE — Telephone Encounter (Signed)
[name] with [facility] left message on physician line requesting RX for rolling walker.  Also states Ms. Nault may need referral to ENT.  RX for walker can be faxed to [facility] at [phone].  (Will need to be written by faculty).  Vernona Rieger can be reached at [phone].  [facility] office number is [phone].    Patient was recently evaluated in [facility].  There was no mention of need for walker or ENT referral.  Will forward to Dr. Louanne Belton for review.

## 2012-11-17 NOTE — Telephone Encounter (Signed)
RX for rolling walker faxed to [facility] at [phone].  Ileana Ladd

## 2012-11-17 NOTE — Telephone Encounter (Signed)
Rx for walker written.  If vertigo continues, will consider referral to vestibular rehab, not ENT.

## 2012-11-20 ENCOUNTER — Encounter: Payer: Self-pay | Admitting: Family Medicine

## 2012-11-20 ENCOUNTER — Ambulatory Visit (INDEPENDENT_AMBULATORY_CARE_PROVIDER_SITE_OTHER): Payer: PRIVATE HEALTH INSURANCE | Admitting: Family Medicine

## 2012-11-20 VITALS — BP 133/94 | HR 71 | Temp 99.3°F | Ht 63.0 in | Wt 100.5 lb

## 2012-11-20 DIAGNOSIS — Z9181 History of falling: Secondary | ICD-10-CM

## 2012-11-20 DIAGNOSIS — R42 Dizziness and giddiness: Secondary | ICD-10-CM

## 2012-11-20 NOTE — Patient Instructions (Signed)
We will set up the CT scan of your head sometime next week. Someone will be in touch with you about vestibular rehab. I have given you prescriptions for a cane and rolling walker.

## 2012-11-25 ENCOUNTER — Ambulatory Visit (HOSPITAL_COMMUNITY)
Admission: RE | Admit: 2012-11-25 | Discharge: 2012-11-25 | Disposition: A | Discharge status: Home / Self Care | Payer: PRIVATE HEALTH INSURANCE | Source: Ambulatory Visit | Attending: Family Medicine | Admitting: Family Medicine

## 2012-11-25 DIAGNOSIS — R42 Dizziness and giddiness: Secondary | ICD-10-CM | POA: Insufficient documentation

## 2012-11-25 DIAGNOSIS — I10 Essential (primary) hypertension: Secondary | ICD-10-CM | POA: Insufficient documentation

## 2012-11-26 ENCOUNTER — Encounter: Payer: Self-pay | Admitting: Family Medicine

## 2012-11-26 NOTE — Assessment & Plan Note (Signed)
It is slightly unclear how much of the patient's current symptoms are related to deconditioning and how much are related to her dizziness. I feel there is a significant amount of deconditioning that is contributing to the patient's symptoms. However, with a reported history of some inability to maintain posture, I wonder if there may be an old stroke in the cerebellum. If so, I am not certain how much physical therapy will help her. Otherwise, she'll eat focus on core strengthening exercises. I will obtain a CT scan of the head to scan for any old strokes. I do not think there is any chance of a acute stroke as there were no new neurologic deficits. Due to the patient's marked essential tremor, I do not think that an MRI would give a satisfactory images.

## 2012-11-26 NOTE — Assessment & Plan Note (Signed)
Does have some vertiginous aspects at this point in time. I will refer to vestibular rehabilitation. I'm also giving a prescription for rolling walker and a cane the patient the use to avoid falls.

## 2012-11-26 NOTE — Progress Notes (Signed)
Patient ID: [name], female   DOB: 10-17-1931, 79 y.o.   MRN: [id number] Subjective: The patient is a 79 y.o. year old female who presents today for  Continued dizziness.  The patient reports that she continues to have episodes of dizziness. She describes the dizziness as a swimming or, possibly, spinning. She is not certain of their particular positions that cause this, however at this point in time her episodes are happening several times per day but last only for several seconds. She reports that meclizine does seem to help. She continues to have some problems with weakness and loss of balance. Her daughter who lives with her reports times when she seems to lose control of her torso and is not strong enough to pull herself into an upright position.  Patient's past medical, social, and family history were reviewed and updated as appropriate. History  Substance Use Topics  . Smoking status: Current Every Day Smoker -- 0.30 packs/day    Types: Cigarettes  . Smokeless tobacco: Not on file  . Alcohol Use: Not on file   Objective:  Filed Vitals:   11/20/12 1547  BP: 133/94  Pulse: 71  Temp: 99.3 F (37.4 C)   Gen: No acute distress, essential tremor is marked. Neuro: Patient has essential tremor as previously noted. She is able to stand and walk without assistance here. Pupils are equal and reactive, however gaze is disconjugate and has been since birth. Otherwise cranial nerves II through XII are intact.   Assessment/Plan:  Please also see individual problems in problem list for problem-specific plans.

## 2012-12-30 ENCOUNTER — Other Ambulatory Visit: Payer: Self-pay | Admitting: Family Medicine

## 2013-01-22 ENCOUNTER — Ambulatory Visit (INDEPENDENT_AMBULATORY_CARE_PROVIDER_SITE_OTHER): Payer: PRIVATE HEALTH INSURANCE | Admitting: *Deleted

## 2013-01-22 DIAGNOSIS — M810 Age-related osteoporosis without current pathological fracture: Secondary | ICD-10-CM

## 2013-01-22 MED ORDER — IBANDRONATE SODIUM 3 MG/3ML IV SOLN
3.0000 mg | Freq: Once | INTRAVENOUS | Status: AC
  Administered 2013-01-22: 3 mg via INTRAVENOUS

## 2013-01-22 NOTE — Progress Notes (Signed)
Pt here today for injection of Boniva - Creatine 1.12 at last BMP - Dr. Raymondo Band verbally cleared for today's adminstration - stated she would need BMP prior to next administration one week prior. Pt informed and verbalized understanding. Pt tolerated well. Ambulatory to waiting room to wait on daughter.

## 2013-01-25 ENCOUNTER — Other Ambulatory Visit: Payer: Self-pay | Admitting: Family Medicine

## 2013-02-09 ENCOUNTER — Other Ambulatory Visit: Payer: Self-pay | Admitting: *Deleted

## 2013-02-10 MED ORDER — PRIMIDONE 50 MG PO TABS: 50.0000 mg | ORAL_TABLET | Freq: Every day | ORAL | Status: DC

## 2013-03-22 ENCOUNTER — Other Ambulatory Visit: Payer: Self-pay | Admitting: Family Medicine

## 2013-03-23 ENCOUNTER — Other Ambulatory Visit: Payer: Self-pay | Admitting: *Deleted

## 2013-03-23 MED ORDER — LISINOPRIL 2.5 MG PO TABS: 2.5000 mg | ORAL_TABLET | Freq: Every day | ORAL | Status: DC

## 2013-04-20 ENCOUNTER — Other Ambulatory Visit: Payer: Self-pay | Admitting: Family Medicine

## 2013-05-13 ENCOUNTER — Telehealth: Payer: Self-pay | Admitting: Family Medicine

## 2013-05-13 NOTE — Telephone Encounter (Signed)
Pt called and requested a refill on her BP medication jw

## 2013-05-16 NOTE — Telephone Encounter (Signed)
Patient needs to clarify Metoprolol dose.

## 2013-05-18 ENCOUNTER — Encounter: Payer: Self-pay | Admitting: Family Medicine

## 2013-05-18 ENCOUNTER — Ambulatory Visit (INDEPENDENT_AMBULATORY_CARE_PROVIDER_SITE_OTHER): Payer: PRIVATE HEALTH INSURANCE | Admitting: Family Medicine

## 2013-05-18 VITALS — BP 158/76 | HR 74 | Temp 98.1°F | Ht 63.0 in | Wt 99.0 lb

## 2013-05-18 DIAGNOSIS — M810 Age-related osteoporosis without current pathological fracture: Secondary | ICD-10-CM

## 2013-05-18 DIAGNOSIS — I10 Essential (primary) hypertension: Secondary | ICD-10-CM

## 2013-05-18 LAB — BASIC METABOLIC PANEL
CO2: 26 mEq/L (ref 19–32)
Calcium: 9.5 mg/dL (ref 8.4–10.5)
Chloride: 108 mEq/L (ref 96–112)
Glucose, Bld: 88 mg/dL (ref 70–99)
Sodium: 139 mEq/L (ref 135–145)

## 2013-05-18 MED ORDER — METOPROLOL SUCCINATE ER 100 MG PO TB24: 100.0000 mg | ORAL_TABLET | Freq: Every day | ORAL | Status: AC

## 2013-05-18 MED ORDER — PRIMIDONE 50 MG PO TABS: 50.0000 mg | ORAL_TABLET | Freq: Every day | ORAL | Status: AC

## 2013-05-18 MED ORDER — MIRTAZAPINE 15 MG PO TABS: ORAL_TABLET | ORAL | Status: AC

## 2013-05-18 MED ORDER — SIMVASTATIN 40 MG PO TABS: 40.0000 mg | ORAL_TABLET | Freq: Every day | ORAL | Status: AC

## 2013-05-18 MED ORDER — ASPIRIN 81 MG PO TBEC: 81.0000 mg | DELAYED_RELEASE_TABLET | Freq: Every day | ORAL | Status: AC

## 2013-05-18 MED ORDER — IBANDRONATE SODIUM 3 MG/3ML IV SOLN: 3.0000 mg | Freq: Once | INTRAVENOUS | Status: AC

## 2013-05-18 MED ORDER — LISINOPRIL 2.5 MG PO TABS: 2.5000 mg | ORAL_TABLET | Freq: Every day | ORAL | Status: AC

## 2013-05-18 NOTE — Assessment & Plan Note (Signed)
Medications refilled today. BP slightly elevated above new JNC8 recommendation of 150/90. Will follow up at next visit.  No changes made today.

## 2013-05-18 NOTE — Patient Instructions (Addendum)
It was nice to see you today.  I have refilled all of your medications today.  Please let me know if you need anything.  You can follow up with me in 6 months or earlier if needed.   You boniva injection can be done once your lab work is complete.

## 2013-05-18 NOTE — Progress Notes (Signed)
Subjective:     Patient ID: [name], female   DOB: 1932-06-02, 79 y.o.   MRN: [id number]  HPI 80 year old female with CAD, HTN, HLD, and osteoporosis who presents for medication refill and to establish care with me.  Patient has no complaints today.  She states that she is feeling well.  Patient did express her displeasure of having to change doctors frequently (due to the nature of a residency clinic).  Patient also in need of Boniva injection (last one was 3 months ago).  She denies chest pain, SOB, abdominal pain, n/v.  She states that her tremor is well controlled. Review of Systems Per HPI    Objective:   Physical Exam Filed Vitals:   05/18/13 1036  BP: 158/76  Pulse: 74  Temp: 98.1 F (36.7 C)   Exam: General: well appearing thin female in NAD. Cardiovascular: RRR. No murmurs, rubs, or gallops. Respiratory: CTAB. No rales, rhonchi, or wheeze. Abdomen: soft, nontender, nondistended. Extremities: No LE edema.     Assessment:     See Problem list     Plan:

## 2013-05-18 NOTE — Assessment & Plan Note (Signed)
BMP today to check renal function prior to Boniva injection.

## 2013-05-21 ENCOUNTER — Ambulatory Visit: Payer: Self-pay | Admitting: Family Medicine

## 2013-06-09 ENCOUNTER — Ambulatory Visit (INDEPENDENT_AMBULATORY_CARE_PROVIDER_SITE_OTHER): Payer: PRIVATE HEALTH INSURANCE | Admitting: *Deleted

## 2013-06-09 DIAGNOSIS — M810 Age-related osteoporosis without current pathological fracture: Secondary | ICD-10-CM

## 2013-06-09 MED ORDER — IBANDRONATE SODIUM 3 MG/3ML IV SOLN
3.0000 mg | Freq: Once | INTRAVENOUS | Status: AC
  Administered 2013-06-09: 3 mg via INTRAVENOUS

## 2013-06-09 NOTE — Progress Notes (Signed)
Patient here today for Boniva injection. Creatinine on 05/18/13 was 1.21. Boniva injection given, patient tolerated well, site unremarkable.Aurther Loft, LPN

## 2013-08-27 ENCOUNTER — Telehealth: Payer: Self-pay | Admitting: Family Medicine

## 2013-08-27 NOTE — Telephone Encounter (Signed)
Needs refill on meclizine [facility] Needs it before Monday due to transportation

## 2013-08-30 ENCOUNTER — Telehealth: Payer: Self-pay | Admitting: Family Medicine

## 2013-08-30 NOTE — Telephone Encounter (Signed)
There is no meclizine on file. Is she asking for Mirtazapine?

## 2013-08-30 NOTE — Telephone Encounter (Signed)
Pt called and needs a refill on her meclizine sent to her pharmacy. jw

## 2013-08-31 MED ORDER — MECLIZINE HCL 25 MG PO TABS: 25.0000 mg | ORAL_TABLET | Freq: Three times a day (TID) | ORAL | Status: AC | PRN

## 2013-08-31 NOTE — Telephone Encounter (Signed)
Dr Adriana Simasook  If you see ,Dr Louanne Beltonitch prescribed  This medication for dizziness in March of 2014.Daniell Mancinas, Virgel BouquetGiovanna S

## 2013-08-31 NOTE — Addendum Note (Signed)
Addended by: Tommie SamsOOK, Teneka Malmberg G on: 08/31/2013 01:35 PM   Modules accepted: Orders

## 2013-08-31 NOTE — Telephone Encounter (Signed)
Please call patient regarding this. Thanks

## 2013-08-31 NOTE — Telephone Encounter (Signed)
Med refilled.

## 2014-01-26 ENCOUNTER — Telehealth: Payer: Self-pay | Admitting: Family Medicine

## 2014-02-09 NOTE — Telephone Encounter (Signed)
Called to schedule IAWV. When pt returns call please schedule appt for 60 minutes.  ° °
